# Patient Record
Sex: Male | Born: 1975 | Race: White | Hispanic: No | Marital: Single | Smoking: Current every day smoker
Health system: Southern US, Community
[De-identification: ages and names within clinical notes are randomized; demographics above are authoritative.]

## PROBLEM LIST (undated history)

## (undated) DIAGNOSIS — I10 Essential (primary) hypertension: Secondary | ICD-10-CM

## (undated) DIAGNOSIS — Z8639 Personal history of other endocrine, nutritional and metabolic disease: Secondary | ICD-10-CM

---

## 2012-02-03 ENCOUNTER — Encounter (HOSPITAL_COMMUNITY): Payer: Self-pay | Admitting: Emergency Medicine

## 2012-02-03 ENCOUNTER — Emergency Department (HOSPITAL_COMMUNITY)
Admission: EM | Admit: 2012-02-03 | Discharge: 2012-02-04 | Disposition: A | Discharge status: Home / Self Care | Payer: BC Managed Care – PPO | Attending: Emergency Medicine | Admitting: Emergency Medicine

## 2012-02-03 DIAGNOSIS — F410 Panic disorder [episodic paroxysmal anxiety] without agoraphobia: Secondary | ICD-10-CM | POA: Insufficient documentation

## 2012-02-03 DIAGNOSIS — R780 Finding of alcohol in blood: Secondary | ICD-10-CM

## 2012-02-03 HISTORY — DX: Essential (primary) hypertension: I10

## 2012-02-03 LAB — CBC WITH DIFFERENTIAL/PLATELET
Basophils Relative: 1 % (ref 0–1)
Eosinophils Relative: 5 % (ref 0–5)
HCT: 46.7 % (ref 39.0–52.0)
Hemoglobin: 16.8 g/dL (ref 13.0–17.0)
MCHC: 36 g/dL (ref 30.0–36.0)
MCV: 92.8 fL (ref 78.0–100.0)
Monocytes Absolute: 0.7 10*3/uL (ref 0.1–1.0)
Monocytes Relative: 7 % (ref 3–12)
Neutro Abs: 4.1 10*3/uL (ref 1.7–7.7)

## 2012-02-03 LAB — BASIC METABOLIC PANEL
BUN: 10 mg/dL (ref 6–23)
Chloride: 98 mEq/L (ref 96–112)
GFR calc Af Amer: >90 mL/min (ref >90)
GFR calc non Af Amer: >90 mL/min (ref >90)
Potassium: 3.5 mEq/L (ref 3.5–5.1)
Sodium: 138 mEq/L (ref 135–145)

## 2012-02-03 LAB — ETHANOL: Alcohol, Ethyl (B): 239 mg/dL — ABNORMAL HIGH (ref 0–11)

## 2012-02-03 NOTE — ED Notes (Addendum)
Pt states had a panic attack Friday morning. Stated he was having a bad day, had never had anything happen like that before. Pt here to get cleared so he can go back to work.

## 2012-02-03 NOTE — ED Notes (Signed)
Patient's significant other reports that patient has been having panic attacks. When I ask patient what he wants to be seen for, patient states that he is fine today. Reports that other days he freaks out and can't handle it. Patient reports calling out of work and states that he was told he needed a psych evaluation before returning back to work. States he wants to speak to a psychiatrist.

## 2012-02-04 LAB — RAPID URINE DRUG SCREEN, HOSP PERFORMED
Barbiturates: NOT DETECTED
Cocaine: NOT DETECTED
Opiates: NOT DETECTED

## 2012-02-04 MED ORDER — LORAZEPAM 0.5 MG PO TABS: 0.5000 mg | ORAL_TABLET | Freq: Three times a day (TID) | ORAL | Status: AC | PRN

## 2012-02-04 MED ORDER — ACETAMINOPHEN 325 MG PO TABS
650.0000 mg | ORAL_TABLET | ORAL | Status: DC | PRN
Start: 2012-02-04 — End: 2012-02-04

## 2012-02-04 NOTE — ED Notes (Signed)
Called to recheck on telepsych, advised it should not be too much longer.

## 2012-02-04 NOTE — ED Notes (Signed)
EDP stated she discarded paprework & script since she did not get to speak to pt before leaving.

## 2012-02-04 NOTE — ED Notes (Signed)
telepsych report given to EDP.

## 2012-02-04 NOTE — ED Provider Notes (Signed)
05:50 Dr Henderson Cloud has done telepsych consult. Feel patient had a panic attack, recommends lorazepam 0.5 mg PRN return of symptoms.  Pt left without getting his discharge instructions or his return to work note.   Devoria Albe, MD, FACEP   Ward Givens, MD 02/04/12 [phone]

## 2012-02-04 NOTE — ED Notes (Addendum)
Secretary called again in regards to telepsych. Was advised had been signed up for by Dr. Jerilynn Som awaiting Dr call.

## 2012-02-04 NOTE — ED Notes (Addendum)
Family member stating she has to leave in the next 5 minutes to meet other obligations she has this morning. Advised EDP was working to get them out as soon as possible but would not be in the next 5 minutes. Pt left w/o any paperwork, did not sign any paperwork. Stated they would return later to get pt papers. EDP notified.

## 2012-02-04 NOTE — ED Notes (Signed)
UDS testing done at [facility] & returned negative. EDP notified.

## 2012-02-04 NOTE — ED Notes (Addendum)
Family member upset w/ the delay in pt seeing psychiatrist.

## 2012-02-04 NOTE — ED Provider Notes (Signed)
Medical screening examination/treatment/procedure(s) were performed by non-physician practitioner and as supervising physician I was immediately available for consultation/collaboration.   Carleene Cooper III, MD 02/04/12 [phone]

## 2012-02-04 NOTE — ED Notes (Signed)
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  Pt denied complaints at this time and states he will return if he has any concerns.  Pt ambulatory without diff, discharged in company of his mother
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
## 2012-02-04 NOTE — ED Notes (Signed)
Pt doing telepsych interview at this time.

## 2012-03-10 ENCOUNTER — Emergency Department (HOSPITAL_COMMUNITY)
Admission: EM | Admit: 2012-03-10 | Discharge: 2012-03-10 | Disposition: A | Discharge status: Home / Self Care | Payer: BC Managed Care – PPO | Attending: Emergency Medicine | Admitting: Emergency Medicine

## 2012-03-10 ENCOUNTER — Emergency Department (HOSPITAL_COMMUNITY): Payer: BC Managed Care – PPO

## 2012-03-10 ENCOUNTER — Encounter (HOSPITAL_COMMUNITY): Payer: Self-pay | Admitting: *Deleted

## 2012-03-10 DIAGNOSIS — Y9229 Other specified public building as the place of occurrence of the external cause: Secondary | ICD-10-CM | POA: Insufficient documentation

## 2012-03-10 DIAGNOSIS — S0230XA Fracture of orbital floor, unspecified side, initial encounter for closed fracture: Secondary | ICD-10-CM

## 2012-03-10 DIAGNOSIS — S022XXA Fracture of nasal bones, initial encounter for closed fracture: Secondary | ICD-10-CM | POA: Insufficient documentation

## 2012-03-10 DIAGNOSIS — S02400A Malar fracture unspecified, initial encounter for closed fracture: Secondary | ICD-10-CM | POA: Insufficient documentation

## 2012-03-10 DIAGNOSIS — S02401A Maxillary fracture, unspecified, initial encounter for closed fracture: Secondary | ICD-10-CM | POA: Insufficient documentation

## 2012-03-10 DIAGNOSIS — S02402A Zygomatic fracture, unspecified, initial encounter for closed fracture: Secondary | ICD-10-CM

## 2012-03-10 DIAGNOSIS — H5710 Ocular pain, unspecified eye: Secondary | ICD-10-CM | POA: Insufficient documentation

## 2012-03-10 DIAGNOSIS — M542 Cervicalgia: Secondary | ICD-10-CM | POA: Insufficient documentation

## 2012-03-10 DIAGNOSIS — H5789 Other specified disorders of eye and adnexa: Secondary | ICD-10-CM | POA: Insufficient documentation

## 2012-03-10 MED ORDER — AMOXICILLIN-POT CLAVULANATE 875-125 MG PO TABS: 1.0000 | ORAL_TABLET | Freq: Two times a day (BID) | ORAL | Status: AC

## 2012-03-10 MED ORDER — OXYCODONE-ACETAMINOPHEN 5-325 MG PO TABS
1.0000 | ORAL_TABLET | Freq: Once | ORAL | Status: AC
  Administered 2012-03-10: 1 via ORAL
  Filled 2012-03-10 (×2): qty 1

## 2012-03-10 MED ORDER — OXYCODONE-ACETAMINOPHEN 5-325 MG PO TABS: 1.0000 | ORAL_TABLET | Freq: Once | ORAL | Status: DC

## 2012-03-10 MED ORDER — OXYCODONE-ACETAMINOPHEN 5-325 MG PO TABS: 1.0000 | ORAL_TABLET | ORAL | Status: AC | PRN
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
## 2012-03-10 NOTE — ED Notes (Signed)
Pt is resting comfortably and sats 90% on RA while sleeping and in no distress
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
## 2012-03-10 NOTE — ED Notes (Signed)
Pt with right sided facial swelling, rt black eye.
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
## 2012-03-11 NOTE — ED Provider Notes (Signed)
Medical screening examination/treatment/procedure(s) were conducted as a shared visit with non-physician practitioner(s) and myself.  I personally evaluated the patient during the encounter  Cheri Guppy, MD 03/11/12 2314
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
## 2014-11-24 ENCOUNTER — Inpatient Hospital Stay
Admission: EM | Admit: 2014-11-24 | Discharge: 2014-11-30 | DRG: 885 | Disposition: A | Discharge status: Home / Self Care | Payer: No Typology Code available for payment source | Source: Other Acute Inpatient Hospital | Attending: Psychiatry | Admitting: Psychiatry

## 2014-11-24 DIAGNOSIS — Z8249 Family history of ischemic heart disease and other diseases of the circulatory system: Secondary | ICD-10-CM

## 2014-11-24 DIAGNOSIS — I10 Essential (primary) hypertension: Secondary | ICD-10-CM | POA: Diagnosis present

## 2014-11-24 DIAGNOSIS — E785 Hyperlipidemia, unspecified: Secondary | ICD-10-CM

## 2014-11-24 DIAGNOSIS — F332 Major depressive disorder, recurrent severe without psychotic features: Secondary | ICD-10-CM | POA: Diagnosis present

## 2014-11-24 DIAGNOSIS — G4700 Insomnia, unspecified: Secondary | ICD-10-CM | POA: Diagnosis present

## 2014-11-24 DIAGNOSIS — F17210 Nicotine dependence, cigarettes, uncomplicated: Secondary | ICD-10-CM | POA: Diagnosis present

## 2014-11-24 DIAGNOSIS — F4010 Social phobia, unspecified: Secondary | ICD-10-CM

## 2014-11-24 DIAGNOSIS — F419 Anxiety disorder, unspecified: Secondary | ICD-10-CM | POA: Diagnosis present

## 2014-11-24 DIAGNOSIS — F1020 Alcohol dependence, uncomplicated: Secondary | ICD-10-CM

## 2014-11-24 DIAGNOSIS — E780 Pure hypercholesterolemia: Secondary | ICD-10-CM | POA: Diagnosis present

## 2014-11-24 DIAGNOSIS — F17200 Nicotine dependence, unspecified, uncomplicated: Secondary | ICD-10-CM

## 2014-11-24 HISTORY — DX: Personal history of other endocrine, nutritional and metabolic disease: Z86.39

## 2014-11-24 MED ORDER — HYDROXYZINE HCL 50 MG/ML IM SOLN
50.0000 mg | Freq: Three times a day (TID) | INTRAMUSCULAR | Status: DC | PRN
  Filled 2014-11-24: qty 1

## 2014-11-24 MED ORDER — MAGNESIUM HYDROXIDE 400 MG/5ML PO SUSP: 30.0000 mL | Freq: Every day | ORAL | Status: DC | PRN

## 2014-11-24 MED ORDER — HYDRALAZINE HCL 25 MG PO TABS
25.0000 mg | ORAL_TABLET | Freq: Three times a day (TID) | ORAL | Status: DC | PRN
  Administered 2014-11-25 – 2014-11-29 (×5): 25 mg via ORAL
  Filled 2014-11-24 (×7): qty 1

## 2014-11-24 MED ORDER — IBUPROFEN 600 MG PO TABS
600.0000 mg | ORAL_TABLET | Freq: Four times a day (QID) | ORAL | Status: DC | PRN
  Administered 2014-11-27 – 2014-11-29 (×5): 600 mg via ORAL
  Filled 2014-11-24 (×7): qty 1

## 2014-11-24 MED ORDER — CITALOPRAM HYDROBROMIDE 20 MG PO TABS
20.0000 mg | ORAL_TABLET | Freq: Every day | ORAL | Status: DC
  Administered 2014-11-25 – 2014-11-30 (×6): 20 mg via ORAL
  Filled 2014-11-24 (×6): qty 1

## 2014-11-24 MED ORDER — NICOTINE 10 MG IN INHA
1.0000 | RESPIRATORY_TRACT | Status: DC | PRN
  Administered 2014-11-25: 1 via RESPIRATORY_TRACT
  Filled 2014-11-24: qty 36

## 2014-11-24 MED ORDER — ALUM & MAG HYDROXIDE-SIMETH 200-200-20 MG/5ML PO SUSP: 30.0000 mL | ORAL | Status: DC | PRN

## 2014-11-24 MED ORDER — ACETAMINOPHEN 325 MG PO TABS
650.0000 mg | ORAL_TABLET | Freq: Four times a day (QID) | ORAL | Status: DC | PRN
  Administered 2014-11-24: 650 mg via ORAL
  Filled 2014-11-24: qty 2

## 2014-11-24 MED ORDER — TRAZODONE HCL 100 MG PO TABS
100.0000 mg | ORAL_TABLET | Freq: Every evening | ORAL | Status: DC | PRN
  Administered 2014-11-25 – 2014-11-29 (×5): 100 mg via ORAL
  Filled 2014-11-24 (×5): qty 1

## 2014-11-24 NOTE — Tx Team (Signed)
Initial Interdisciplinary Treatment Plan   PATIENT STRESSORS: Financial difficulties Occupational concerns Substance abuse    PATIENT STRENGTHS: Ability for insight Average or above average intelligence Capable of independent living Physical Health Supportive family/friends  PROBLEM LIST: Problem List/Patient Goals Date to be addressed Date deferred Reason deferred Estimated date of resolution  SI/Attempt 11/24/14     Alcohol Abuse 11/24/14     Depression 11/24/14                                          DISCHARGE CRITERIA:  Improved stabilization in mood, thinking, and/or behavior  PRELIMINARY DISCHARGE PLAN: Outpatient therapy  PATIENT/FAMIILY INVOLVEMENT: This treatment plan has been presented to and reviewed with the patient, Dylan Hughes.  The patient  has been given the opportunity to ask questions and make suggestions.  Hamilton CapriQueenetta H Eudell Julian 11/24/2014, 11:50 PM

## 2014-11-25 DIAGNOSIS — F1020 Alcohol dependence, uncomplicated: Secondary | ICD-10-CM

## 2014-11-25 DIAGNOSIS — F4010 Social phobia, unspecified: Secondary | ICD-10-CM

## 2014-11-25 DIAGNOSIS — F17200 Nicotine dependence, unspecified, uncomplicated: Secondary | ICD-10-CM

## 2014-11-25 DIAGNOSIS — E785 Hyperlipidemia, unspecified: Secondary | ICD-10-CM

## 2014-11-25 LAB — COMPREHENSIVE METABOLIC PANEL
ALK PHOS: 41 U/L (ref 38–126)
ALT: 20 U/L (ref 17–63)
AST: 20 U/L (ref 15–41)
Albumin: 4.3 g/dL (ref 3.5–5.0)
Anion gap: 8 (ref 5–15)
BILIRUBIN TOTAL: 0.4 mg/dL (ref 0.3–1.2)
BUN: 19 mg/dL (ref 6–20)
CO2: 29 mmol/L (ref 22–32)
Calcium: 9.2 mg/dL (ref 8.9–10.3)
Chloride: 98 mmol/L — ABNORMAL LOW (ref 101–111)
Creatinine, Ser: 0.97 mg/dL (ref 0.61–1.24)
Glucose, Bld: 98 mg/dL (ref 65–99)
Potassium: 4 mmol/L (ref 3.5–5.1)
Sodium: 135 mmol/L (ref 135–145)
Total Protein: 7.5 g/dL (ref 6.5–8.1)

## 2014-11-25 LAB — VITAMIN B12: Vitamin B-12: 266 pg/mL (ref 180–914)

## 2014-11-25 LAB — TSH: TSH: 2.581 u[IU]/mL (ref 0.350–4.500)

## 2014-11-25 MED ORDER — SIMVASTATIN 20 MG PO TABS
20.0000 mg | ORAL_TABLET | Freq: Every day | ORAL | Status: DC
  Administered 2014-11-25 – 2014-11-29 (×5): 20 mg via ORAL
  Filled 2014-11-25 (×5): qty 1

## 2014-11-25 MED ORDER — PNEUMOCOCCAL VAC POLYVALENT 25 MCG/0.5ML IJ INJ
0.5000 mL | INJECTION | INTRAMUSCULAR | Status: AC
  Administered 2014-11-26: 0.5 mL via INTRAMUSCULAR
  Filled 2014-11-25: qty 0.5

## 2014-11-25 MED ORDER — HYDROCHLOROTHIAZIDE 12.5 MG PO CAPS
12.5000 mg | ORAL_CAPSULE | Freq: Every day | ORAL | Status: DC
  Administered 2014-11-25 – 2014-11-26 (×2): 12.5 mg via ORAL
  Filled 2014-11-25 (×3): qty 1

## 2014-11-25 MED ORDER — HYDROXYZINE HCL 50 MG PO TABS
50.0000 mg | ORAL_TABLET | Freq: Three times a day (TID) | ORAL | Status: DC | PRN
  Administered 2014-11-25 – 2014-11-29 (×4): 50 mg via ORAL
  Filled 2014-11-25 (×5): qty 1

## 2014-11-25 NOTE — Progress Notes (Signed)
Recreation Therapy Notes  INPATIENT RECREATION THERAPY ASSESSMENT  Patient Details Name: Dylan Hughes MRN: [id number] DOB: 07/08/1976 Today's Date: 11/25/2014  Patient Stressors: Death, Other (Comment) (Trying to find another job and finances)  Coping Skills:   Isolate, Substance Abuse, Avoidance, Self-Injury, Art/Dance, Music, Sports  Personal Challenges: Anger, Concentration, Decision-Making, Expressing Yourself, Relationships, Self-Esteem/Confidence, Social Interaction, Stress Management, Substance Abuse, Trusting Others  Leisure Interests (2+):  Games - Video games, Technical brewerature - Therapist, artishing  Awareness of Community Resources:  No  Community Resources:     Current Use:    If no, Barriers?:    Patient Strengths:  No  Patient Identified Areas of Improvement:  Self-control and drinking  Current Recreation Participation:  Playing on play station  Patient Goal for Hospitalization:  To figure out what his triggers are and not come back, be more open and honest  [place]   Current SI (including self-harm):  No  Current HI:  No  Consent to Intern Participation: N/A   Jacquelynn CreeGreene,Mandeep Ferch M, LRT/CTRS 11/25/2014, 1:51 PM

## 2014-11-25 NOTE — [facility] Group Notes (Signed)
[facility] Group Notes:  (Nursing/MHT/Case Management/Adjunct)  Date:  11/25/2014  Time:  12:41 PM  Type of Therapy:  Psychoeducational Skills  Participation Level:  Active  Participation Quality:  Appropriate, Attentive and Sharing  Affect:  Appropriate  Cognitive:  Alert and Appropriate  Insight:  Appropriate  Engagement in Group:  Engaged  Modes of Intervention:  Discussion and Education  Summary of Progress/Problems:  Lynelle SmokeCara Travis [facility] 11/25/2014, 12:41 PM

## 2014-11-25 NOTE — Progress Notes (Signed)
   11/25/14 1300  Clinical Encounter Type  Visited With Patient  Visit Type Spiritual support  Referral From Physician  Consult/Referral To Chaplain  Spiritual Encounters  Spiritual Needs Emotional  Stress Factors  Patient Stress Factors Major life changes  Family Stress Factors None identified  Advance Directives (For Healthcare)  Does patient have an advance directive? No  Would patient like information on creating an advanced directive? No - patient declined information   Chaplain provided therapeutic presence and empathic listening. Chaplain spoke with patient who was on his way into a group session. Patient said he may want to talk later today. I asked him to page the on-call chaplain if he wants to talk later in the day.   AD [phone]

## 2014-11-25 NOTE — Progress Notes (Signed)
Quiet and reserved, minimal interaction noted with peers. Denies SI although presents with sad affect.  Medication and group compliant.  Maintaining personal care chores appropirately.

## 2014-11-25 NOTE — BH Assessment (Signed)
Assessment Note  Dylan Hughes is an 39 y.o. male  Who [facility] another local ER due to a suicide attempt. Pt. attempted to hang himself. Due to a lack of financial means and work, the pt. became frustrated and upset.   Axis I: Alcohol Abuse and Major Depression, Recurrent severe Axis III:  Past Medical History  Diagnosis Date  . Hypertension   . History of high cholesterol    Axis IV: economic problems, housing problems, other psychosocial or environmental problems and problems related to social environment  Past Medical History:  Past Medical History  Diagnosis Date  . Hypertension   . History of high cholesterol     History reviewed. No pertinent past surgical history.  Family History: History reviewed. No pertinent family history.  Social History:  reports that he has been smoking Cigarettes.  He has a 20 pack-year smoking history. He does not have any smokeless tobacco history on file. He reports that he drinks about 16.8 oz of alcohol per week. He reports that he does not use illicit drugs.  Additional Social History:  Alcohol / Drug Use Pain Medications: None reported Prescriptions: None reported Over the Counter: None reported History of alcohol / drug use?: Yes Longest period of sobriety (when/how long): Unknown Negative Consequences of Use: Financial, Personal relationships, Work / School Withdrawal Symptoms:  (None reported) Substance #1 Name of Substance 1: Alcohol  1 - Age of First Use: Unknown 1 - Amount (size/oz): "Pint, 12 pack and a couple of bottles of wine" 1 - Frequency: Daily 1 - Duration: "Years" 1 - Last Use / Amount: 11/18/2014  CIWA: CIWA-Ar BP: 119/76 mmHg Pulse Rate: 60 Nausea and Vomiting: no nausea and no vomiting Tactile Disturbances: none Tremor: no tremor Auditory Disturbances: not present Paroxysmal Sweats: no sweat visible Visual Disturbances: mild sensitivity Anxiety: no anxiety, at ease Headache, Fullness in  Head: mild Agitation: normal activity Orientation and Clouding of Sensorium: oriented and can do serial additions CIWA-Ar Total: 4 COWS:    Allergies: No Known Allergies  Home Medications:  Medications Prior to Admission  Medication Sig Dispense Refill  . ibuprofen (ADVIL,MOTRIN) 200 MG tablet Take 400 mg by mouth every 6 (six) hours as needed. For pain      OB/GYN Status:  No LMP for male patient.  General Assessment Data Location of Assessment: [facility] Assessment Services TTS Assessment: Out of system Is this a Tele or Face-to-Face Assessment?: Tele Assessment Is this an Initial Assessment or a Re-assessment for this encounter?: Initial Assessment Marital status: Single Maiden name: n/a Is patient pregnant?: No Living Arrangements: Alone Can pt return to current living arrangement?: Yes Admission Status: Involuntary Is patient capable of signing voluntary admission?: No Referral Source: Self/Family/Friend [facility] Insurance type: None  Medical Screening Exam [facility] Walk-in ONLY) Medical Exam completed: Yes  Crisis Care Plan Living Arrangements: Alone Name of Psychiatrist: n/a Name of Therapist: n/a  Education Status Is patient currently in school?: No Current Grade: n/a Highest grade of school patient has completed: 12 Name of school: n/a Contact person: n/a  Risk to self with the past 6 months Suicidal Ideation: Yes-Currently Present Has patient been a risk to self within the past 6 months prior to admission? : Yes Suicidal Intent: Yes-Currently Present Has patient had any suicidal intent within the past 6 months prior to admission? : Yes Is patient at risk for suicide?: Yes Suicidal Plan?: Yes-Currently Present Has patient had any suicidal plan within the past  6 months prior to admission? : Yes Specify Current Suicidal Plan: Pt. attempted to hang himself. Access to Means: Yes Specify Access to Suicidal Means: Have access to cord What has been  your use of drugs/alcohol within the last 12 months?: Alcohol use Previous Attempts/Gestures: Yes How many times?: 5 Other Self Harm Risks: Start cutting a 9 years Triggers for Past Attempts: Family contact (Lack of money) Intentional Self Injurious Behavior: Cutting Comment - Self Injurious Behavior: cutting at age 11 Family Suicide History: No Recent stressful life event(s): Loss (Comment), Conflict (Comment), Other (Comment) Persecutory voices/beliefs?: No Depression: Yes Depression Symptoms: Feeling worthless/self pity, Feeling angry/irritable, Tearfulness, Fatigue, Isolating Substance abuse history and/or treatment for substance abuse?: Yes Suicide prevention information given to non-admitted patients: Not applicable  Risk to Others within the past 6 months Homicidal Ideation: No Does patient have any lifetime risk of violence toward others beyond the six months prior to admission? : No Thoughts of Harm to Others: No Current Homicidal Intent: No Current Homicidal Plan: No Access to Homicidal Means: No Identified Victim: None reported History of harm to others?: No Assessment of Violence: None Noted Violent Behavior Description: None reported Does patient have access to weapons?: No Criminal Charges Pending?: No Does patient have a court date: No Is patient on probation?: No  Psychosis Hallucinations: None noted Delusions: None noted  Mental Status Report Appearance/Hygiene: Unremarkable Eye Contact: Other (Comment) Motor Activity: Unremarkable, Freedom of movement Speech: Logical/coherent Level of Consciousness: Alert Mood: Depressed, Anxious Affect: Sad Anxiety Level: Minimal Thought Processes: Coherent, Relevant Judgement: Unimpaired Orientation: Person, Place, Time, Situation, Appropriate for developmental age Obsessive Compulsive Thoughts/Behaviors: None  Cognitive Functioning Concentration: Normal Memory: Recent Intact, Remote Intact IQ: Average Insight:  Poor Impulse Control: Poor Appetite: Fair Weight Loss: 0 Weight Gain: 0 Sleep: No Change Total Hours of Sleep: 7 Vegetative Symptoms: None  ADLScreening [facility] Assessment Services) Patient's cognitive ability adequate to safely complete daily activities?: Yes Patient able to express need for assistance with ADLs?: Yes Independently performs ADLs?: Yes (appropriate for developmental age)  Prior Inpatient Therapy Prior Inpatient Therapy: Yes Prior Therapy Dates: 2002 Prior Therapy Facilty/Provider(s): Unknown Reason for Treatment: Suicidal attempt  Prior Outpatient Therapy Prior Outpatient Therapy: No Prior Therapy Dates: n/a Prior Therapy Facilty/Provider(s): n/a Reason for Treatment: n/a Does patient have an ACCT team?: No Does patient have Intensive In-House Services?  : No Does patient have [facility] services? : No Does patient have P4CC services?: No  ADL Screening (condition at time of admission) Patient's cognitive ability adequate to safely complete daily activities?: Yes Is the patient deaf or have difficulty hearing?: No Does the patient have difficulty seeing, even when wearing glasses/contacts?: No Does the patient have difficulty concentrating, remembering, or making decisions?: Yes (Sometimes concentrating. Memory is bad.) Patient able to express need for assistance with ADLs?: Yes Does the patient have difficulty dressing or bathing?: No Independently performs ADLs?: Yes (appropriate for developmental age) Does the patient have difficulty walking or climbing stairs?: No Weakness of Legs: None Weakness of Arms/Hands: None  Home Assistive Devices/Equipment Home Assistive Devices/Equipment: None  Therapy Consults (therapy consults require a physician order) PT Evaluation Needed: No OT Evalulation Needed: No SLP Evaluation Needed: No Abuse/Neglect Assessment (Assessment to be complete while patient is alone) Physical Abuse: Denies Verbal Abuse: Denies Sexual  Abuse: Denies Exploitation of patient/patient's resources: Denies Self-Neglect: Denies Values / Beliefs Cultural Requests During Hospitalization: None Spiritual Requests During Hospitalization: None Consults Spiritual Care Consult Needed: No Social Work Consult Needed: No Advance  Directives (For Healthcare) Does patient have an advance directive?: No Would patient like information on creating an advanced directive?: No - patient declined information Nutrition Screen- [facility] Adult/WL/AP Patient's home diet: Regular Has the patient recently lost weight without trying?: No Has the patient been eating poorly because of a decreased appetite?: No Malnutrition Screening Tool Score: 0  Additional Information 1:1 In Past 12 Months?: No CIRT Risk: No Elopement Risk: No Does patient have medical clearance?: Yes  Child/Adolescent Assessment Running Away Risk: Denies (Pt. is an adult)  Disposition:  Disposition Initial Assessment Completed for this Encounter: Yes Disposition of Patient: Inpatient treatment program Type of inpatient treatment program: [facility]  On Site Evaluation by:   Reviewed with Physician:    Lilyan Gilfordalvin J. Esperanza Madrazo, MS, LCAS, LPC, NCC, CCSI 11/25/2014 7:59 PM

## 2014-11-25 NOTE — Progress Notes (Signed)
39 year old male transferred [facility].  Patient went to the ER after being found by his girlfriend. Pt was in the closet with a cord around his neck. During admission process patient says he doesn't know what triggered his attempt. Endorses feeling depressed and hopeless, anxious, poor energy, and sad. Currently denies si/hi and avh. Expresses worry about having a job and place to live when he is discharged. He also reports that he has had several suicide attempts in the past. Says he has laid in the [place], jumped out of a 2-story window when he was about 39 years old, cut his wrist,  and also walked into traffic. Pt says that he started cutting himself at age 89. Last cut himself 2 months ago. Says he cuts when angry or frustrated. Drinks every other day. Says he drinks whatever he can afford at the time. He may drink a pint of liquor, or 12 pack of beer, or a couple of bottles of wine. Says he has never been in withdrawals. Reports last drink was Wednesday (11/18/14). BP elevated 155/108. Rechecked 158/112. Dr. Christia ReadingHernedez notified. Order for Hydralazine 25mg  at 0001. Recheck 119/76 pulse 60. Smokes at least 1 pack of cigarettes per day (rolls his own cigarettes).  Patient is monitored Q 15 minutes for safety. Will cont to monitor and provide support.

## 2014-11-25 NOTE — H&P (Signed)
Psychiatric Admission Assessment Adult  Patient Identification: Dylan Hughes MRN:  [id number] Date of Evaluation:  11/25/2014 Chief Complaint:  major depression Principal Diagnosis: Major depressive disorder, recurrent episode, severe Diagnosis:   Patient Active Problem List   Diagnosis Date Noted  . Alcohol use disorder, severe, dependence [F10.20] 11/25/2014  . Tobacco use disorder [Z72.0] 11/25/2014  . Social anxiety disorder [F40.10] 11/25/2014  . Major depressive disorder, recurrent episode, severe [F33.2] 11/25/2014  . HTN (hypertension) [I10] 11/25/2014  . 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  This patient was referred for psychiatric admission by [facility] after a suicidal attempt. On May 12 patient attempted to hang himself with a quart that he had over a close at all in his house. He guesses the cord broke because he was awakened by her [place] police officer hold took him to the emergency room. The police officer reported he had received a phone call from the patient's girlfriend, the patient had called her and told her that he was going to harm himself. When the officer arrived at the house he found the patient lying on the closet floor. He was unconscious, naked and had an empty alcohol container lying right next to him. At arrival to the emergency room his alcohol blood level was 180. Today the patient reported that he is overwhelmed with his current situation. He lost his driver's license due to DWIs. He is working at [employer] restaurant only a few hours a week not making enough money to cover all his bills. He is forced to only eat once a day as he cannot afford more than that. He has refused to go to social services and apply for food stains because "I don't want any handouts".  Patient is currently looking for another  job but because he is a felon he is having difficulty finding another employment. He reports a prior history of depression but denies ever being treated for it. The patient has attempted suicide in the past by jumping out of a window when he was a child and cutting his wrists a few years ago.  Patient reports his thyroid bleeding in this way but denies wanting to hurt himself now. He realizes how this suicidal attempt has harm his girlfriend and other family members (father and his children).   In terms of substance abuse the patient reports drinking alcohol heavily since he was a teenager. Currently he has been drinking about every other day but can drink a large amount of alcohol. He feels he has a high tolerance for it. In the past he reports having blackouts but denies ever experiencing withdrawals.  He denies ever being admitted to detox or rehabilitation.  He attended AA only one time. Denies the use of any other illicit substances or abusing prescription medications. As a teenager he used cannabis but has not used it in several years. He smokes about one pack of cigarettes per day.  Patient acknowledged that alcohol plays a big part in exacerbating his depression he feels that he will benefit from treatment for his alcoholism.   Elements:  Severity:  Severe. Timing:  Chronic with acute exacerbation. Duration:  6 days. Context:  Alcohol intoxication and multiple social and financial stressors. Associated Signs/Symptoms: Depression Symptoms:  depressed mood, suicidal attempt, anxiety, (Hypo) Manic Symptoms:  none Anxiety Symptoms:  Specific Phobias, patient does describe difficulties with social  settings. Identifies himself as shy and introverted. In order to participate in a social activity he feels he is forced to drink first.   Psychotic Symptoms:  none PTSD Symptoms: Denies history of traumatic events Negative Total Time spent with patient: 1 hour   Past psychiatric history: Patient  denies prior treatment for mental illness or substance abuse. In 2002 the patient was intoxicated and then cut his wrists which require sutures. Patient was admitted to an inpatient facility for a short period of time (48-72 hours) but as he minimize the symptoms and the severity of his depression that letting go. Patient also reports that at around the age of 8 he jumped out of a window in an attempt to hurt himself he did not suffer any injuries to his parents never knew about this. The patient reports history of self-injurious behaviors he has been cutting himself superficially since the age of 66. He has not done so in a few months. He shows some scars in his abdomen upper arms and legs.  Past Medical History: Patient suffers from hypertension but is not currently taking any medications. He also suffers from dyslipidemia and takes simvastatin 20 mg a day. Denies any surgical history. He denies any history of seizures. He does report having a means to cut accidents with head trauma and loss of consciousness. Patient reports that after the accident he finds remembering means of people or things difficult. Past Medical History  Diagnosis Date  . Hypertension   . History of high cholesterol    History reviewed. No pertinent past surgical history.  Family History: History reviewed. No pertinent family history. patient denies any history of mental illness in his family. Denies any history of substance abuse in his family. Denies any history of suicidal attempts in his family. Patient's mother passed away of congestive heart failure in 2007. Patient states heart problems run in his family  Social History: This patient is originally from [facility]. 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 Patient remembers being removed from the custody of his father and mother when he and his sister were very young,  around age of 5 he does not remember to  recent why.  The patient is currently separated from his wife; they had 2 children together who are 12 and 14. Patient's children currently live with the patient's father who has custody of them. . The patient is currently working at [employer]. Has been employed there since February of this year. He does not receive insurance through his employer and is only working 5 hours 3 days  a week He lives alone in an apartment in [place]. He has a girlfriend who is supportive. Educational level: Patient completed ninth grade was incarcerated he completed GED. A few years ago patient received multiple certifications at [employer]. . Legal history: Patient was in prison for 3 years. He is a felon due to charges pressed by his wife due to aggravated assault. The patient also had 3 DWIs and currently does not have a driver's license.   History  Alcohol Use  . 16.8 oz/week  . 8 Glasses of wine, 12 Cans of beer, 8 Shots of liquor per week    Comment: Every other day.     History  Drug Use No    History   Social History  . Marital Status: Single    Spouse Name: N/A  .  Number of Children: N/A  . Years of Education: N/A   Social History Main Topics  . Smoking status: Current Every Day Smoker -- 1.00 packs/day for 20 years    Types: Cigarettes  . Smokeless tobacco: Not on file  . Alcohol Use: 16.8 oz/week    8 Glasses of wine, 12 Cans of beer, 8 Shots of liquor per week     Comment: Every other day.  . Drug Use: No  . Sexual Activity: Yes   Other Topics Concern  . None   Social History Narrative   Musculoskeletal: Strength & Muscle Tone: within normal limits Gait & Station: normal Patient leans: N/A  Psychiatric Specialty Exam: Physical Exam  Constitutional: He is oriented to person, place, and time. He appears well-developed and well-nourished.  HENT:  Head: Normocephalic and atraumatic.  Eyes: EOM are normal.  Neck: Normal range of motion. Neck  supple.  Cardiovascular: Normal rate and regular rhythm.   Respiratory: Effort normal and breath sounds normal.  GI: Soft. Bowel sounds are normal.  Musculoskeletal: Normal range of motion.  Neurological: He is alert and oriented to person, place, and time.  Skin: Skin is warm and dry.  Physical exam [facility].  Review of Systems  Constitutional: Negative.   HENT: Negative.   Eyes: Negative.   Respiratory: Negative.   Cardiovascular: Positive for chest pain.  Gastrointestinal: Negative.   Genitourinary: Negative.   Musculoskeletal: Negative.   Skin: Negative.   Neurological: Negative.   Endo/Heme/Allergies: Negative.   Psychiatric/Behavioral: Positive for depression and substance abuse. The patient is nervous/anxious.     Blood pressure 119/76, pulse 60, temperature 98.2 F (36.8 C), temperature source Oral, resp. rate 20, height 5' 2.01" (1.575 m), weight 53.978 kg (119 lb), SpO2 98 %.Body mass index is 21.76 kg/(m^2).  General Appearance: Well Groomed  Patent attorneyye Contact::  Good  Speech:  Normal Rate  Volume:  Normal  Mood:  Anxious and Dysphoric  Affect:  Congruent  Thought Process:  Linear and Logical  Orientation:  Full (Time, Place, and Person)  Thought Content:  Hallucinations: None  Suicidal Thoughts:  No  Homicidal Thoughts:  No  Memory:  Immediate;   Good Recent;   Good Remote;   Good  Judgement:  Fair  Insight:  Fair  Psychomotor Activity:  Normal  Concentration:  Good  Recall:  NA  Fund of Knowledge:Good  Language: Good  Akathisia:  No  Handed:    AIMS (if indicated):     Assets:  Communication Skills Desire for Improvement Housing Intimacy Physical Health Social Support Vocational/Educational  ADL's:  Intact  Cognition: WNL  Sleep:  Number of Hours: 5   Risk to Self: Is patient at risk for suicide?: Yes (Currently denies si. Contracts for safety.) Risk to Others:   Prior Inpatient Therapy:   Prior Outpatient  Therapy:    Alcohol Screening: 1. How often do you have a drink containing alcohol?: 4 or more times a week 2. How many drinks containing alcohol do you have on a typical day when you are drinking?: 10 or more 3. How often do you have six or more drinks on one occasion?: Daily or almost daily Preliminary Score: 8 4. How often during the last year have you found that you were not able to stop drinking once you had started?: Daily or almost daily 5. How often during the last year have you failed to do what was normally expected from you becasue of  drinking?: Less than monthly 6. How often during the last year have you needed a first drink in the morning to get yourself going after a heavy drinking session?: Never 7. How often during the last year have you had a feeling of guilt of remorse after drinking?: Monthly 8. How often during the last year have you been unable to remember what happened the night before because you had been drinking?: Less than monthly 9. Have you or someone else been injured as a result of your drinking?: No 10. Has a relative or friend or a doctor or another health worker been concerned about your drinking or suggested you cut down?: Yes, during the last year Alcohol Use Disorder Identification Test Final Score (AUDIT): 24 Brief Intervention: MD notified of score 20 or above  Allergies:  No Known Allergies Lab Results: No results found for this or any previous visit (from the past 48 hour(s)). Current Medications: Current Facility-Administered Medications  Medication Dose Route Frequency Provider Last Rate Last Dose  . acetaminophen (TYLENOL) tablet 650 mg  650 mg Oral Q6H PRN Jimmy Footman, MD   650 mg at 11/24/14 2233  . alum & mag hydroxide-simeth (MAALOX/MYLANTA) 200-200-20 MG/5ML suspension 30 mL  30 mL Oral Q4H PRN Jimmy Footman, MD      . citalopram (CELEXA) tablet 20 mg  20 mg Oral Daily Jimmy Footman, MD   20 mg at 11/25/14  1011  . hydrALAZINE (APRESOLINE) tablet 25 mg  25 mg Oral TID PRN Jimmy Footman, MD   25 mg at 11/25/14 0001  . hydrochlorothiazide (MICROZIDE) capsule 12.5 mg  12.5 mg Oral Daily Jimmy Footman, MD      . hydrOXYzine (ATARAX/VISTARIL) tablet 50 mg  50 mg Oral TID PRN Jimmy Footman, MD      . ibuprofen (ADVIL,MOTRIN) tablet 600 mg  600 mg Oral Q6H PRN Jimmy Footman, MD      . magnesium hydroxide (MILK OF MAGNESIA) suspension 30 mL  30 mL Oral Daily PRN Jimmy Footman, MD      . nicotine (NICOTROL) 10 MG inhaler 1 continuous puffing  1 continuous puffing Inhalation PRN Jimmy Footman, MD      . Melene Muller ON 11/26/2014] pneumococcal 23 valent vaccine (PNU-IMMUNE) injection 0.5 mL  0.5 mL Intramuscular Tomorrow-1000 Jimmy Footman, MD      . simvastatin (ZOCOR) tablet 20 mg  20 mg Oral q1800 Jimmy Footman, MD      . traZODone (DESYREL) tablet 100 mg  100 mg Oral QHS PRN Jimmy Footman, MD   100 mg at 11/25/14 0001   PTA Medications: Prescriptions prior to admission  Medication Sig Dispense Refill Last Dose  . ibuprofen (ADVIL,MOTRIN) 200 MG tablet Take 400 mg by mouth every 6 (six) hours as needed. For pain   Past Week at Unknown    Previous Psychotropic Medications: No   Substance Abuse History in the last 12 months:  Yes.      Consequences of Substance Abuse: Legal Consequences:  past DWIs, no license Blackouts:    No results found for this or any previous visit (from the past 72 hour(s)).  Observation Level/Precautions:  15 minute checks  Laboratory:  Vitamin B-12 TSH and CMP  Psychotherapy:    Medications:    Consultations:    Discharge Concerns:    Estimated LOS:  Other:     Psychological Evaluations: No   Treatment Plan Summary: Daily contact with patient to assess and evaluate symptoms and progress in treatment and Medication management  Major  depressive disorder: We'll  continue citalopram 20 mg by mouth daily which was started at the outside facility  Anxiety: We will order Vistaril 50 mg by mouth every 8 hours when necessary for anxiety  Insomnia: Patient will be continued on trazodone 100 mg by mouth daily at bedtime when necessary.  Hypertension patient will be started on hydrochlorothiazide 12.5 mg by mouth daily. As blood pressure was elevated yesterday hydralazine 25 mg every 8 hours when necessary has been ordered for systolic blood pressure greater than 155 or diastolic blood pressure greater than 95  Dyslipidemia: Continue simvastatin 20 mg daily   Tobacco use disorder: We will offer the patient a Nicotrol inhaler when necessary  Labs I will order TSH, B12 level and comprehensive metabolic panel asked him if these were not checked at the outside facility. I will also order an EKG as patient today was complaining of having some chest pain  Medical Decision Making:  Established Problem, Worsening (2)  I certify that inpatient services furnished can reasonably be expected to improve the patient's condition.   Jimmy Footman 5/18/201611:03 AM

## 2014-11-25 NOTE — Progress Notes (Signed)
Recreation Therapy Notes  Date: 05.18.16 Time: 3:00 pm Location: Craft Room  Group Topic: Wellness  Goal Area(s) Addresses:  Patient will identify at least one item per dimension of health. Patient will examine areas they are deficient in.  Behavioral Response: Attentive  Intervention: 6 Dimensions of Health  Activity: Patients were given a worksheet with the definitions of the 6 dimensions on it and a worksheet with the 6 dimensions on it. Patients were instructed to list 1-2 things per each category that they were currently doing.  Education: LRT educated patients on the 6 dimensions of wellness.   Education Outcome: Acknowledges education/In group clarification offered  Clinical Observations/Feedback: Patient completed activity by listing at least 2 things per each category. Patient contributed to group discussion by stating what category he was not giving enough attention to, and how this activity id related to his admission and d/c.   Jacquelynn CreeGreene,Aury Scollard M, LRT/CTRS 11/25/2014 4:29 PM

## 2014-11-25 NOTE — [facility] Group Notes (Signed)
[facility]   11/25/2014 2:35 PM  Participation Quality:  Good, patients goal for today was to meet with SW and doctor to review treatment and discharge options  Mood/Affect:  Appropriate  Depression Rating:  5  Anxiety Rating:  5  Thoughts of Suicide:  No Will you contract for safety?   Yes  Current AVH:  No  Plan for Discharge/Comments:  Patient is willing to attend out patient treatment for his alcohol addiction issues. [place]  Transportation Means: This is a struggle for him and girlfriend. However he lives in walking distance to most places.  Supports: Girlfriend, father and 2 kids  [name], [place] M

## 2014-11-25 NOTE — [facility] Suicide Risk Assessment (Signed)
[facility]   Nursing information obtained from:  Patient Demographic factors:  Caucasian Current Mental Status:  NA Loss Factors:  NA Historical Factors:  Prior suicide attempts Risk Reduction Factors:  Responsible for children under 39 years of age, Employed, Positive social support, Positive therapeutic relationship Total Time spent with patient: 1 hour Principal Problem: Major depressive disorder, recurrent episode, severe Diagnosis:   Patient Active Problem List   Diagnosis Date Noted  . Alcohol use disorder, severe, dependence [F10.20] 11/25/2014  . Tobacco use disorder [Z72.0] 11/25/2014  . Social anxiety disorder [F40.10] 11/25/2014  . Major depressive disorder, recurrent episode, severe [F33.2] 11/25/2014  . HTN (hypertension) [I10] 11/25/2014  . Dyslipidemia [E78.5] 11/25/2014     Continued Clinical Symptoms:  Alcohol Use Disorder Identification Test Final Score (AUDIT): 24 The "Alcohol Use Disorders Identification Test", Guidelines for Use in Primary Care, Second Edition.  World Science writerHealth Organization [facility](WHO). Score between 0-7:  no or low risk or alcohol related problems. Score between 8-15:  moderate risk of alcohol related problems. Score between 16-19:  high risk of alcohol related problems. Score 20 or above:  warrants further diagnostic evaluation for alcohol dependence and treatment.   CLINICAL FACTORS:   Depression:   Comorbid alcohol abuse/dependence Alcohol/Substance Abuse/Dependencies Previous Psychiatric Diagnoses and Treatments   Musculoskeletal: Strength & Muscle Tone: within normal limits Gait & Station: normal Patient leans: N/A  Psychiatric Specialty Exam: Physical Exam  ROS                                                           COGNITIVE FEATURES THAT CONTRIBUTE TO RISK:  None    SUICIDE RISK:   Moderate:  Frequent suicidal ideation with limited intensity, and duration, some  specificity in terms of plans, no associated intent, good self-control, limited dysphoria/symptomatology, some risk factors present, and identifiable protective factors, including available and accessible social support.  [facility]  Medical Decision Making:  Established Problem, Worsening (2)  I certify that inpatient services furnished can reasonably be expected to improve the patient's condition.   Jimmy FootmanHernandez-Gonzalez,  Dylan Hughes 11/25/2014, 11:01 AM

## 2014-11-26 DIAGNOSIS — I10 Essential (primary) hypertension: Secondary | ICD-10-CM | POA: Insufficient documentation

## 2014-11-26 DIAGNOSIS — F332 Major depressive disorder, recurrent severe without psychotic features: Secondary | ICD-10-CM

## 2014-11-26 MED ORDER — HYDROCHLOROTHIAZIDE 25 MG PO TABS
25.0000 mg | ORAL_TABLET | Freq: Every day | ORAL | Status: DC
  Administered 2014-11-27 – 2014-11-30 (×4): 25 mg via ORAL
  Filled 2014-11-26 (×5): qty 1

## 2014-11-26 NOTE — Progress Notes (Signed)
Recreation Therapy Notes  Date: 05.19.16  Time: 3:00 pm Location: Craft Room  Group Topic: Leisure Education  Goal Area(s) Addresses:  Increase patient leisure awareness and education  Behavioral Response: Attentive, Interactive  Intervention: Leisure Interview  Activity: Patients paired up and were given a list of questions about leisure interests to ask their peer.   Education: LRT educated patients on leisure.  Education Outcome: Acknowledges education/In group clarification offered  Clinical Observations/Feedback: Patient completed activity by pairing up with peer and asking as well as answering questions. Patient contributed to group discussion by stating what is needed to participate in leisure, that he does not use his time wisely, and some ways he can participate in leisure more often.   Jacquelynn CreeGreene,Nan Maya M, LRT/CTRS 11/26/2014 5:13 PM

## 2014-11-26 NOTE — Progress Notes (Signed)
[facility] MD Progress Note  11/26/2014 9:56 AM Dylan Hughes  MRN:  [id number] Subjective:  Patient was distressed this morning as he had received a phone call from his girlfriend that upset him.  His girlfriend told him that the neighbor downstairs was asking her to have intercourse with him and he was very Careers adviser and inappropriate. She also told him that the landlord had broken the law from the store and had gotten into his apartment because the ACT was on in the neighbors downstairs were complaining that their house was to cold because of it.  . The patient was told that the door was left open and willing his girlfriend went into the house the refrigerator dose was opened and somebody had gone through his things.  Describes mood as depressed, problems with appetite, or energy.  Last night he did not sleep well. Denies SI, HI or A/V H.  Denies hallucinations, SE from medications or physical complaints.   Per nursing: Quiet and reserved, minimal interaction noted with peers. Denies SI although presents with sad affect. Medication and group compliant. Maintaining personal care chores appropirately.  Principal Problem: Major depressive disorder, recurrent, severe without psychotic features Diagnosis:   Patient Active Problem List   Diagnosis Date Noted  . Major depressive disorder, recurrent, severe without psychotic features [F33.2]   . Essential hypertension [I10]   . Alcohol use disorder, severe, dependence [F10.20] 11/25/2014  . Tobacco use disorder [Z72.0] 11/25/2014  . Social anxiety disorder [F40.10] 11/25/2014  . Dyslipidemia [E78.5] 11/25/2014   Total Time spent with patient: 30 minutes   Past Medical History:  Past Medical History  Diagnosis Date  . Hypertension   . History of high cholesterol    History reviewed. No pertinent past surgical history. Family History: History reviewed. No pertinent family history. Social History:  History  Alcohol Use  . 16.8 oz/week  . 8 Glasses of  wine, 12 Cans of beer, 8 Shots of liquor per week    Comment: Every other day.     History  Drug Use No    History   Social History  . Marital Status: Single    Spouse Name: N/A  . Number of Children: N/A  . Years of Education: N/A   Social History Main Topics  . Smoking status: Current Every Day Smoker -- 1.00 packs/day for 20 years    Types: Cigarettes  . Smokeless tobacco: Not on file  . Alcohol Use: 16.8 oz/week    8 Glasses of wine, 12 Cans of beer, 8 Shots of liquor per week     Comment: Every other day.  . Drug Use: No  . Sexual Activity: Yes   Other Topics Concern  . None   Social History Narrative   Additional History:    Sleep: Fair  Appetite:  Good   Assessment:   Musculoskeletal: Strength & Muscle Tone: within normal limits Gait & Station: normal Patient leans: N/A   Psychiatric Specialty Exam: Physical Exam  Review of Systems  Cardiovascular: Negative for chest pain.  Gastrointestinal: Negative for nausea, vomiting, abdominal pain, diarrhea and constipation.  Neurological: Positive for headaches. Negative for dizziness.  Psychiatric/Behavioral: Positive for depression. The patient has insomnia.     Blood pressure 126/86, pulse 64, temperature 98.2 F (36.8 C), temperature source Oral, resp. rate 20, height 5' 2.01" (1.575 m), weight 53.978 kg (119 lb), SpO2 98 %.Body mass index is 21.76 kg/(m^2).  General Appearance: Well Groomed  Eye Contact::  Good  Speech:  Normal  Rate  Volume:  Normal  Mood:  Dysphoric  Affect:  Constricted  Thought Process:  Linear  Orientation:  Full (Time, Place, and Person)  Thought Content:  Hallucinations: None  Suicidal Thoughts:  No  Homicidal Thoughts:  No  Memory:  Immediate;   Good Recent;   Good Remote;   Good  Judgement:  Poor  Insight:  Fair  Psychomotor Activity:  Decreased  Concentration:  Good  Recall:  NA  Fund of Knowledge:Good  Language: Good  Akathisia:  No  Handed:    AIMS (if  indicated):     Assets:  Communication Skills Desire for Improvement Housing Physical Health Social Support Vocational/Educational  ADL's:  Intact  Cognition: WNL  Sleep:  Number of Hours: 5     Current Medications: Current Facility-Administered Medications  Medication Dose Route Frequency Provider Last Rate Last Dose  . acetaminophen (TYLENOL) tablet 650 mg  650 mg Oral Q6H PRN Hildred Priest, MD   650 mg at 11/24/14 2233  . alum & mag hydroxide-simeth (MAALOX/MYLANTA) 200-200-20 MG/5ML suspension 30 mL  30 mL Oral Q4H PRN Hildred Priest, MD      . citalopram (CELEXA) tablet 20 mg  20 mg Oral Daily Hildred Priest, MD   20 mg at 11/26/14 0943  . hydrALAZINE (APRESOLINE) tablet 25 mg  25 mg Oral TID PRN Hildred Priest, MD   25 mg at 11/25/14 2152  . [START ON 11/27/2014] hydrochlorothiazide (HYDRODIURIL) tablet 25 mg  25 mg Oral Daily Hildred Priest, MD      . hydrOXYzine (ATARAX/VISTARIL) tablet 50 mg  50 mg Oral TID PRN Hildred Priest, MD   50 mg at 11/25/14 2153  . ibuprofen (ADVIL,MOTRIN) tablet 600 mg  600 mg Oral Q6H PRN Hildred Priest, MD      . magnesium hydroxide (MILK OF MAGNESIA) suspension 30 mL  30 mL Oral Daily PRN Hildred Priest, MD      . nicotine (NICOTROL) 10 MG inhaler 1 continuous puffing  1 continuous puffing Inhalation PRN Hildred Priest, MD   1 continuous puffing at 11/25/14 1803  . pneumococcal 23 valent vaccine (PNU-IMMUNE) injection 0.5 mL  0.5 mL Intramuscular Tomorrow-1000 Hildred Priest, MD      . simvastatin (ZOCOR) tablet 20 mg  20 mg Oral q1800 Hildred Priest, MD   20 mg at 11/25/14 1802  . traZODone (DESYREL) tablet 100 mg  100 mg Oral QHS PRN Hildred Priest, MD   100 mg at 11/25/14 2152    Lab Results:  Results for orders placed or performed during the hospital encounter of 11/24/14 (from the past 48 hour(s))  TSH      Status: None   Collection Time: 11/25/14  1:46 PM  Result Value Ref Range   TSH 2.581 0.350 - 4.500 uIU/mL  Comprehensive metabolic panel     Status: Abnormal   Collection Time: 11/25/14  1:46 PM  Result Value Ref Range   Sodium 135 135 - 145 mmol/L   Potassium 4.0 3.5 - 5.1 mmol/L   Chloride 98 (L) 101 - 111 mmol/L   CO2 29 22 - 32 mmol/L   Glucose, Bld 98 65 - 99 mg/dL   BUN 19 6 - 20 mg/dL   Creatinine, Ser 0.97 0.61 - 1.24 mg/dL   Calcium 9.2 8.9 - 10.3 mg/dL   Total Protein 7.5 6.5 - 8.1 g/dL   Albumin 4.3 3.5 - 5.0 g/dL   AST 20 15 - 41 U/L   ALT 20 17 - 63 U/L  Alkaline Phosphatase 41 38 - 126 U/L   Total Bilirubin 0.4 0.3 - 1.2 mg/dL   GFR calc non Af Amer >60 >60 mL/min   GFR calc Af Amer >60 >60 mL/min    Comment: (NOTE) The eGFR has been calculated using the CKD EPI equation. This calculation has not been validated in all clinical situations. eGFR's persistently <60 mL/min signify possible Chronic Kidney Disease.    Anion gap 8 5 - 15  Vitamin B12     Status: None   Collection Time: 11/25/14  3:00 PM  Result Value Ref Range   Vitamin B-12 266 180 - 914 pg/mL    Comment: (NOTE) This assay is not validated for testing neonatal or myeloproliferative syndrome specimens for Vitamin B12 levels. Performed at [facility]     Physical Findings: AIMS: Facial and Oral Movements Muscles of Facial Expression: None, normal Lips and Perioral Area: None, normal Jaw: None, normal Tongue: None, normal,Extremity Movements Upper (arms, wrists, hands, fingers): None, normal Lower (legs, knees, ankles, toes): None, normal, Trunk Movements Neck, shoulders, hips: None, normal, Overall Severity Severity of abnormal movements (highest score from questions above): None, normal Incapacitation due to abnormal movements: None, normal Patient's awareness of abnormal movements (rate only patient's report): No Awareness, Dental Status Current problems with teeth and/or  dentures?: No Does patient usually wear dentures?: No  CIWA:  CIWA-Ar Total: 4 COWS:     Treatment Plan Summary: Daily contact with patient to assess and evaluate symptoms and progress in treatment   Major depressive disorder: We'll continue citalopram 20 mg by mouth daily which was started at the outside facility  Anxiety: We will order Vistaril 50 mg by mouth every 8 hours when necessary for anxiety  Insomnia: Patient will be continued on trazodone I will increase the dose to 150 mg by mouth daily at bedtime when necessary   Hypertension: Blood pressure continues to be elevated. His heart rate is between 60s and 70s. I will increase the dose of hydrochlorothiazide to 25 mg by mouth daily. Continue hydralazine when necessary  every 8 hours when necessary has been ordered for systolic blood pressure greater than 482 or diastolic blood pressure greater than 95.  I will change the patient's diet to low sodium.  Dyslipidemia: Continue simvastatin 20 mg daily   Tobacco use disorder: We will offer the patient a Nicotrol inhaler when necessary  Labs: TSH, vitamin B12 and comprehensive metabolic panel level are within the normal limits. EKG completed yesterday: Normal sinus rhythm, normal EKG   Medical Decision Making:  Established Problem, Stable/Improving (1)     Hildred Priest 11/26/2014, 9:56 AM

## 2014-11-26 NOTE — Progress Notes (Signed)
Continues to have a sad affect. Rates depression and anxiety as a 6.   Denies SI although continues to have thoughts of hopelessness.  Medication and group compliant.  Visible in milieu, no inappropriate behavior noted.

## 2014-11-26 NOTE — Plan of Care (Signed)
Problem: Ineffective individual coping Goal: STG: Patient will remain free from self harm Outcome: Progressing Patient remains free of injury, safe on the unit.

## 2014-11-26 NOTE — Tx Team (Signed)
Interdisciplinary Treatment Plan Update (Adult)  Date:  11/26/2014 Time Reviewed:  8:35 AM  Progress in Treatment: Attending groups: Yes. Participating in groups:  Yes. Taking medication as prescribed:  Yes. Tolerating medication:  Yes. Family/Significant othe contact made:  No, will contact:    Patient understands diagnosis:  Yes. Discussing patient identified problems/goals with staff:  Yes. Medical problems stabilized or resolved:  Yes. Denies suicidal/homicidal ideation: Yes. Issues/concerns per patient self-inventory:  Yes. Other:  New problem(s) identified: No, Describe:     Discharge Plan or Barriers:  Reason for Continuation of Hospitalization: Suicidal ideation  Comments: patient is detoxing and will need follow up care in [place]  Estimated length of stay: 5 day  New goal(s):  Review of initial/current patient goals per problem list:   Refer to plan of care  Attendees: Patient:  Dylan Hughes 5/19/20168:35 AM  Family:   5/19/20168:35 AM  Physician:  Dr Ardyth HarpsHernandez 5/19/20168:35 AM  Nursing:    5/19/20168:35 AM  Case Manager:   5/19/20168:35 AM  Counselor:   5/19/20168:35 AM  Other:  [name] 5/19/20168:35 AM  Other:  [name] 5/19/20168:35 AM  Other:  [name] 5/19/20168:35 AM  Other:  5/19/20168:35 AM  Other:  5/19/20168:35 AM  Other:  5/19/20168:35 AM  Other:  5/19/20168:35 AM  Other:  5/19/20168:35 AM  Other:  5/19/20168:35 AM  Other:   5/19/20168:35 AM   Scribe for Treatment Team:   [name], [name], 11/26/2014, 8:35 AM

## 2014-11-26 NOTE — [facility] Group Notes (Signed)
[facility] Group Notes:  (Nursing/MHT/Case Management/Adjunct)  Date:  11/26/2014  Time:  2:50 PM  Type of Therapy:  Group Therapy  Participation Level:  Did Not Attend  Participation Quality:  none  Affect:  none  Cognitive:  none  Insight:  None  Engagement in Group:  none  Modes of Intervention:  none  Summary of Progress/Problems:  Mayra NeerJackie L Masaki Rothbauer 11/26/2014, 2:50 PM

## 2014-11-26 NOTE — Tx Team (Signed)
Interdisciplinary Treatment Plan Update (Adult)  Date:  11/26/2014 Time Reviewed:  10:27 AM  Progress in Treatment: Attending groups: Yes. Participating in groups:  Yes. Taking medication as prescribed:  Yes. Tolerating medication:  Yes. Family/Significant othe contact made:  No, will contact:  family per pt consent Patient understands diagnosis:  Yes. Discussing patient identified problems/goals with staff:  Yes. Medical problems stabilized or resolved:  Yes. Denies suicidal/homicidal ideation: Yes. Issues/concerns per patient self-inventory:  No. Other:  New problem(s) identified: No, Describe:  none  Discharge Plan or Barriers: Home, [facility] [place]  Reason for Continuation of Hospitalization: Depression Medication stabilization  Comments:  Estimated length of stay: Up to 4 days  New goal(s):  Review of initial/current patient goals per problem list:   See care plan  Attendees: Patient:   5/19/201610:27 AM  Family:   5/19/201610:27 AM  Physician:  Jimmy FootmanAndrea Hernandez-Gonzalez, MD 5/19/201610:27 AM  Nursing:    5/19/201610:27 AM  Case Manager:   5/19/201610:27 AM  Counselor:   5/19/201610:27 AM  Other:  Rojelio BrennerLee Ann Rouse, PsyD 5/19/201610:27 AM  Other:  Beryl MeagerJason Ingle, LCSWA 5/19/201610:27 AM  Other:  Delorse LekLynn Kathee Tumlin, LCSW 5/19/201610:27 AM  Other: Jake SharkSara Laws, LCSW 5/19/201610:27 AM  Other: Hershal CoriaBeth Greene, LRT 5/19/201610:27 AM  Other:  5/19/201610:27 AM  Other:  5/19/201610:27 AM  Other:  5/19/201610:27 AM  Other:  5/19/201610:27 AM  Other:   5/19/201610:27 AM   Scribe for Treatment Team:   [place]ynn Johnson [place], LCSW [phone]  [place], Dardenne PrairieLynetta D, 11/26/2014, 10:27 AM

## 2014-11-26 NOTE — Plan of Care (Signed)
Problem: [facility] Participation in Recreation Therapeutic Interventions Goal: STG-Patient will demonstrate improved self esteem by identif STG: Self-Esteem - Within 5 treatment sessions, patient will verbalize at least 5 positive affirmation statements in each of 3 treatment sessions to increase self-esteem post d/c.  Outcome: Progressing Treatment Session 1; Completed 1 out of 2: At approximately 11:45 am, LRT met with patient in craft room. Patient verbalized 5 positive affirmation statements. Patient reported it felt "bland". LRT encouraged patient to continue saying positive affirmation statements.  Leonette Monarch, LRT/CTRS 05.19.16 1:45 pm Goal: STG-Patient will identify at least five coping skills for ** STG: Coping Skills - Within 4 treatment sessions, patient will verbalize at least 5 coping skills for substance abuse in each of 2 treatment sessions to decrease substance abuse post d/c.  Outcome: Progressing Treatment Session 1; Completed 1 out of 2: At approximately 11:45 am, LRT met with patient in craft room. Patient verbalized 5 coping skills for substance abuse. LRT educated patient on leisure and why it is important to implement into his schedule. LRT provided patient with blank schedules to help him plan his day and try to avoid using substances. LRT educated patient on healthy support systems and why they are important.  Leonette Monarch, LRT/CTRS 05.19.16 1:47 pm Goal: STG-Other Recreation Therapy Goal (Specify) STG: Stress Management - Within 5 treatment sessions, patient will demonstrate at least one stress management technique in each of 2 treatment sessions to increase stress management skills post d/c.  Outcome: Progressing Treatment Session 1; Completed 0 out of 2: At approximately 11:45 am, LRT met with patient in craft room. LRT educated and provided patient with handouts on stress management techniques. Patient verbalized understanding. LRT encouraged patient to read over  and practice stress management techniques.  Leonette Monarch, LRT/CTRS 05.19.16 1:50 pm

## 2014-11-26 NOTE — [facility] Group Notes (Signed)
[facility] Group Notes:  (Nursing/MHT/Case Management/Adjunct)  Date:  11/26/2014  Time:  9:40 AM  Type of Therapy:  Community Meeting   Participation Level:  Active  Participation Quality:  Appropriate and Attentive  Affect:  Appropriate  Cognitive:  Appropriate  Insight:  Appropriate  Engagement in Group:  Engaged  Modes of Intervention:  Discussion  Summary of Progress/Problems:  Dylan Hughes De'Chelle Jeroline Wolbert 11/26/2014, 9:40 AM

## 2014-11-26 NOTE — [facility] Group Notes (Signed)
[facility] LCSW Group Therapy  11/26/2014 3:47 PM  Type of Therapy:  Group Therapy  Participation Level:  Active  Participation Quality:  Appropriate and Attentive  Affect:  Appropriate  Cognitive:  Alert, Appropriate and Oriented  Insight:  Engaged  Engagement in Therapy:  Engaged  Modes of Intervention:  Socialization and Support  Summary of Progress/Problems: Pt attended group and participated appropriately. Pt shared that he gets joy from "my 2 kids".   Beryl Meagerngle, Rodgers Likes T 11/26/2014, 3:47 PM

## 2014-11-27 MED ORDER — NICOTINE 10 MG IN INHA: 1.0000 | RESPIRATORY_TRACT | Status: DC | PRN

## 2014-11-27 MED ORDER — SIMVASTATIN 20 MG PO TABS: 20.0000 mg | ORAL_TABLET | Freq: Every day | ORAL | Status: DC

## 2014-11-27 MED ORDER — TRAZODONE HCL 100 MG PO TABS: 100.0000 mg | ORAL_TABLET | Freq: Every evening | ORAL | Status: DC | PRN

## 2014-11-27 MED ORDER — CITALOPRAM HYDROBROMIDE 20 MG PO TABS: 20.0000 mg | ORAL_TABLET | Freq: Every day | ORAL | Status: DC

## 2014-11-27 MED ORDER — HYDROCHLOROTHIAZIDE 25 MG PO TABS: 25.0000 mg | ORAL_TABLET | Freq: Every day | ORAL | Status: DC

## 2014-11-27 NOTE — [facility] Group Notes (Signed)
[facility]  11/27/2014 1:49 PM  Participation Quality:  Appropriate, Attentive and Supportive  Affect:  Appropriate  Cognitive:  Alert, Appropriate and Oriented  Insight:  Engaged  Engagement in Group:  Engaged  Modes of Intervention:  Education, Socialization and Support  Summary of Progress/Problems: Pt attended group and partciapated appropriately in discussion. Pt reports that his SMART goal is to "relax".  Pt shared lots of insight to his experience with 'relapse' and his plans for 'relapse prevention' once he is discharged from the hospital.   Beryl Meagerngle, Odessia Asleson T 11/27/2014, 1:49 PM

## 2014-11-27 NOTE — Progress Notes (Signed)
Recreation Therapy Notes  Date: 05.20.16 Time: 3:00 pm Location: Craft Room  Group Topic: Problem solving, Communication, Teamwork  Goal Area(s) Addresses:  Patient will effectively work with peer towards shared goal. Patient will identify skills used to make activity successful. Patient will identify benefit of using group skills effectively post d/c.  Behavioral Response: Attentive, Interactive  Intervention: [employer] Tower  Activity: Patients were given 15 pipe cleaners and instructed to build the Berkshire Hathawaytallest freestanding tower. After about 5 minutes, LRT told patients they had to put their dominant hand behind their back. After 3 minutes, LRT instructed patients that they could not talk to each other.  Education:LRT educated patient on communication, problem solving, and teamwork and how those skills relate to healthy support systems.  Education Outcome: Acknowledges education/In group clarification offered  Clinical Observations/Feedback: Patient worked with peers to build tower. Patient used effective communication, problem solving, and teamwork. Patient contributed to group discussion by stating how his team worked effectively together, how they used communication, problem solving, and teamwork effectively, how these skills are related to building a healthy support system, how he felt after he used these skills in group, what prevents him from using these skills at home, and what would change if he started using these skills effectively at home.   Jacquelynn CreeGreene,Miarose Lippert M, LRT/CTRS 11/27/2014 4:24 PM

## 2014-11-27 NOTE — [facility] Group Notes (Signed)
[facility] Group Notes:  (Nursing/MHT/Case Management/Adjunct)  Date:  11/27/2014  Time:  11:53 AM  Type of Therapy:  Group Therapy  Participation Level:  Active  Participation Quality:  Appropriate and Attentive  Affect:  Appropriate  Cognitive:  Alert, Appropriate and Oriented  Insight:  Appropriate  Engagement in Group:  Engaged  Modes of Intervention:  Activity  Summary of Progress/Problems:  Keilin Gamboa De'Chelle Zakiah Gauthreaux 11/27/2014, 11:53 AM

## 2014-11-27 NOTE — Progress Notes (Signed)
Pleasant and cooperative.  Smiles easily when approached.  Visible in the milieu.  Verbalizes that he is feeling much better.  Medication and group compliant.  Maintaining personal care chores appropriately.

## 2014-11-27 NOTE — Progress Notes (Signed)
Alert and oriented x 4, affect is flat, sad and labile, denies SI/HI, no acute distress noted interacting appropriately with peers and staff, 15 minutes checks maintained will continue to monitor.

## 2014-11-27 NOTE — Plan of Care (Signed)
Problem: Ineffective individual coping Goal: STG:Pt. will utilize relaxation techniques to reduce stress STG: Patient will utilize relaxation techniques to reduce stress levels  Outcome: Progressing Patient appears less anxious interacting with peers appropriately.

## 2014-11-27 NOTE — Progress Notes (Signed)
[facility] MD Progress Note  11/27/2014 12:28 PM Dylan Hughes  MRN:  [id number] Subjective:  Patient reports feeling much calmer today. States that after talking about his problems just today with this Probation officer and with the recreational therapies he came out with a plan on how to confront he's landlord and his downstairs neighbor. He plans to write things down while he is here in practice in order not to come across as aggressive or threatening.  Patient continues to feel depressed but denies suicidality, homicidality or having auditory or visual hallucinations. Denies side effects from medications. Reports that he slept well with the help of the trazodone. Denies having issues with him appetite or energy and concentration is limited and he is still feeling overwhelmed by all his as stressors. Denies major physical complaints but then reported having headaches.  Patient requested medication in order to help him quit drinking. He reported interest in trying Antabuse.  Per nursing: Patient is still reporting depression and hopelessness.  Principal Problem: Major depressive disorder, recurrent, severe without psychotic features Diagnosis:   Patient Active Problem List   Diagnosis Date Noted  . Major depressive disorder, recurrent, severe without psychotic features [F33.2]   . Essential hypertension [I10]   . Alcohol use disorder, severe, dependence [F10.20] 11/25/2014  . Tobacco use disorder [Z72.0] 11/25/2014  . Social anxiety disorder [F40.10] 11/25/2014  . Dyslipidemia [E78.5] 11/25/2014   Total Time spent with patient: 30 minutes   Past Medical History:  Past Medical History  Diagnosis Date  . Hypertension   . History of high cholesterol    History reviewed. No pertinent past surgical history. Family History: History reviewed. No pertinent family history. Social History:  History  Alcohol Use  . 16.8 oz/week  . 8 Glasses of wine, 12 Cans of beer, 8 Shots of liquor per week    Comment:  Every other day.     History  Drug Use No    History   Social History  . Marital Status: Single    Spouse Name: N/A  . Number of Children: N/A  . Years of Education: N/A   Social History Main Topics  . Smoking status: Current Every Day Smoker -- 1.00 packs/day for 20 years    Types: Cigarettes  . Smokeless tobacco: Not on file  . Alcohol Use: 16.8 oz/week    8 Glasses of wine, 12 Cans of beer, 8 Shots of liquor per week     Comment: Every other day.  . Drug Use: No  . Sexual Activity: Yes   Other Topics Concern  . None   Social History Narrative   Additional History:    Sleep: Fair  Appetite:  Good   Assessment:   Musculoskeletal: Strength & Muscle Tone: within normal limits Gait & Station: normal Patient leans: N/A   Psychiatric Specialty Exam: Physical Exam   Review of Systems  Respiratory: Negative for cough.   Cardiovascular: Negative for chest pain.  Gastrointestinal: Negative for nausea, vomiting, abdominal pain and diarrhea.  Skin: Negative for rash.  Neurological: Positive for headaches.  Psychiatric/Behavioral: Positive for depression and substance abuse. Negative for suicidal ideas and hallucinations. The patient is nervous/anxious. The patient does not have insomnia.     Blood pressure 115/81, pulse 63, temperature 98.1 F (36.7 C), temperature source Oral, resp. rate 20, height 5' 2.01" (1.575 m), weight 53.978 kg (119 lb), SpO2 98 %.Body mass index is 21.76 kg/(m^2).  General Appearance: Well Groomed  Eye Contact::  Good  Speech:  Normal  Rate  Volume:  Normal  Mood:  Dysphoric  Affect:  Constricted  Thought Process:  Linear  Orientation:  Full (Time, Place, and Person)  Thought Content:  Hallucinations: None  Suicidal Thoughts:  No  Homicidal Thoughts:  No  Memory:  Immediate;   Good Recent;   Good Remote;   Good  Judgement:  Poor  Insight:  Fair  Psychomotor Activity:  Decreased  Concentration:  Good  Recall:  NA  Fund of  Knowledge:Good  Language: Good  Akathisia:  No  Handed:    AIMS (if indicated):     Assets:  Communication Skills Desire for Improvement Housing Physical Health Social Support Vocational/Educational  ADL's:  Intact  Cognition: WNL  Sleep:  Number of Hours: 7.5     Current Medications: Current Facility-Administered Medications  Medication Dose Route Frequency Provider Last Rate Last Dose  . acetaminophen (TYLENOL) tablet 650 mg  650 mg Oral Q6H PRN Hildred Priest, MD   650 mg at 11/24/14 2233  . alum & mag hydroxide-simeth (MAALOX/MYLANTA) 200-200-20 MG/5ML suspension 30 mL  30 mL Oral Q4H PRN Hildred Priest, MD      . citalopram (CELEXA) tablet 20 mg  20 mg Oral Daily Hildred Priest, MD   20 mg at 11/27/14 0919  . hydrALAZINE (APRESOLINE) tablet 25 mg  25 mg Oral TID PRN Hildred Priest, MD   25 mg at 11/25/14 2152  . hydrochlorothiazide (HYDRODIURIL) tablet 25 mg  25 mg Oral Daily Hildred Priest, MD   25 mg at 11/27/14 0919  . hydrOXYzine (ATARAX/VISTARIL) tablet 50 mg  50 mg Oral TID PRN Hildred Priest, MD   50 mg at 11/25/14 2153  . ibuprofen (ADVIL,MOTRIN) tablet 600 mg  600 mg Oral Q6H PRN Hildred Priest, MD   600 mg at 11/27/14 0919  . magnesium hydroxide (MILK OF MAGNESIA) suspension 30 mL  30 mL Oral Daily PRN Hildred Priest, MD      . nicotine (NICOTROL) 10 MG inhaler 1 continuous puffing  1 continuous puffing Inhalation PRN Hildred Priest, MD   1 continuous puffing at 11/25/14 1803  . simvastatin (ZOCOR) tablet 20 mg  20 mg Oral q1800 Hildred Priest, MD   20 mg at 11/26/14 1710  . traZODone (DESYREL) tablet 100 mg  100 mg Oral QHS PRN Hildred Priest, MD   100 mg at 11/25/14 2152    Lab Results:  Results for orders placed or performed during the hospital encounter of 11/24/14 (from the past 48 hour(s))  TSH     Status: None   Collection Time: 11/25/14   1:46 PM  Result Value Ref Range   TSH 2.581 0.350 - 4.500 uIU/mL  Comprehensive metabolic panel     Status: Abnormal   Collection Time: 11/25/14  1:46 PM  Result Value Ref Range   Sodium 135 135 - 145 mmol/L   Potassium 4.0 3.5 - 5.1 mmol/L   Chloride 98 (L) 101 - 111 mmol/L   CO2 29 22 - 32 mmol/L   Glucose, Bld 98 65 - 99 mg/dL   BUN 19 6 - 20 mg/dL   Creatinine, Ser 0.97 0.61 - 1.24 mg/dL   Calcium 9.2 8.9 - 10.3 mg/dL   Total Protein 7.5 6.5 - 8.1 g/dL   Albumin 4.3 3.5 - 5.0 g/dL   AST 20 15 - 41 U/L   ALT 20 17 - 63 U/L   Alkaline Phosphatase 41 38 - 126 U/L   Total Bilirubin 0.4 0.3 - 1.2 mg/dL  GFR calc non Af Amer >60 >60 mL/min   GFR calc Af Amer >60 >60 mL/min    Comment: (NOTE) The eGFR has been calculated using the CKD EPI equation. This calculation has not been validated in all clinical situations. eGFR's persistently <60 mL/min signify possible Chronic Kidney Disease.    Anion gap 8 5 - 15  Vitamin B12     Status: None   Collection Time: 11/25/14  3:00 PM  Result Value Ref Range   Vitamin B-12 266 180 - 914 pg/mL    Comment: (NOTE) This assay is not validated for testing neonatal or myeloproliferative syndrome specimens for Vitamin B12 levels. Performed at [facility]     Physical Findings: AIMS: Facial and Oral Movements Muscles of Facial Expression: None, normal Lips and Perioral Area: None, normal Jaw: None, normal Tongue: None, normal,Extremity Movements Upper (arms, wrists, hands, fingers): None, normal Lower (legs, knees, ankles, toes): None, normal, Trunk Movements Neck, shoulders, hips: None, normal, Overall Severity Severity of abnormal movements (highest score from questions above): None, normal Incapacitation due to abnormal movements: None, normal Patient's awareness of abnormal movements (rate only patient's report): No Awareness, Dental Status Current problems with teeth and/or dentures?: No Does patient usually wear  dentures?: No  CIWA:  CIWA-Ar Total: 4 COWS:     Treatment Plan Summary: Daily contact with patient to assess and evaluate symptoms and progress in treatment   Major depressive disorder: We'll continue citalopram 20 mg by mouth daily which was started at the outside facility  Anxiety: We will order Vistaril 50 mg by mouth every 8 hours when necessary for anxiety  Alcohol use disorder: AST and ALT are within the normal limits. Patient is interested in a trial of Antabuse. We'll give prescription for 250 mg to use every other day at discharge.  Insomnia: Patient will be continued on trazodone I will increase the dose to 150 mg by mouth daily at bedtime when necessary   Hypertension: Hydrochlorothiazide was increased yesterday to 25 mg by mouth daily. Continue hydralazine when necessary  every 8 hours when necessary has been ordered for systolic blood pressure greater than 005 or diastolic blood pressure greater than 95.  Continue low sodium diet  Dyslipidemia: Continue simvastatin 20 mg daily   Tobacco use disorder: We will offer the patient a Nicotrol inhaler when necessary  Labs: TSH, vitamin B12 and comprehensive metabolic panel level are within the normal limits. EKG completed yesterday: Normal sinus rhythm, normal EKG  Discharge planning: Patient will be observed over the weekend if he does okay with plan to discharge early next week.  [facility] as they have a intensive outpatient substance abuse treatment.  Medical Decision Making:  Established Problem, Stable/Improving (1)     Hildred Priest 11/27/2014, 12:28 PM

## 2014-11-27 NOTE — Plan of Care (Signed)
Problem: [facility] Participation in Recreation Therapeutic Interventions Goal: STG-Patient will demonstrate improved self esteem by identif STG: Self-Esteem - Within 5 treatment sessions, patient will verbalize at least 5 positive affirmation statements in each of 3 treatment sessions to increase self-esteem post d/c.  Outcome: Progressing Treatment Session 2; Completed 2 out of 3: At approximately 11:50 am, LRT met with patient in craft room. Patient verbalized 5 positive affirmation statements. Patient reported it felt "better". LRT encouraged patient to continue saying positive affirmation statements.  Leonette Monarch, LRT/CTRS 05.20.16 1:36 pm Goal: STG-Patient will identify at least five coping skills for ** STG: Coping Skills - Within 4 treatment sessions, patient will verbalize at least 5 coping skills for substance abuse in each of 2 treatment sessions to decrease substance abuse post d/c.  Outcome: Completed/Met Date Met:  11/27/14 Treatment Session 2; Completed 2 out of 2: At approximately 11:50 am, LRT met with patient in craft room. Patient verbalized 5 coping skills for substance abuse. Patient reported he did not have any questions.  Leonette Monarch, LRT/CTRS 05.20.16 1:38 pm Goal: STG-Other Recreation Therapy Goal (Specify) STG: Stress Management - Within 5 treatment sessions, patient will demonstrate at least one stress management technique in each of 2 treatment sessions to increase stress management skills post d/c.  Outcome: Progressing Treatment Session 2; Completed 1 out of 2: At approximately 11:50 am, LRT met with patient in craft room. Patient demonstrated one stress management technique. LRT encouraged patient to continue practicing the stress management techniques.  Leonette Monarch, LRT/CTRS 05.20.16 1:40 pm

## 2014-11-28 NOTE — [facility] Group Notes (Signed)
[facility] Group Notes:  (Nursing/MHT/Case Management/Adjunct)  Date:  11/28/2014  Time:  10:20 PM  Type of Therapy:  Group Therapy  Participation Level:  Active  Participation Quality:  Appropriate and Attentive  Affect:  Appropriate  Cognitive:  Appropriate  Insight:  Appropriate  Engagement in Group:  Engaged  Modes of Intervention:  Support  Summary of Progress/Problems:  Murrell ReddenOlivia Briana Morgan-Little 11/28/2014, 10:20 PM

## 2014-11-28 NOTE — [facility] Group Notes (Signed)
[facility]   May 20th,2016 1pm  Type of Therapy:  Group Therapy  Participation Level:  Active  Participation Quality:  Attentive  Affect:  Appropriate  Cognitive:  Appropriate  Insight:  Engaged  Engagement in Therapy:  Developing/Improving  Modes of Intervention:  Clarification, Exploration and Problem-solving  Summary of Progress/Problems: Group to topic today was discussing and sharing ideas involving community resources and what has worked in the past for patients. This patient was insightful and helpful to his peers. LCSW will provide patient with resources he can access in [place] before he discharges.  [name], Dylan Hughes M 11/28/2014, 12:46 PM

## 2014-11-28 NOTE — [facility] Group Notes (Signed)
[facility] Group Notes:  (Nursing/MHT/Case Management/Adjunct)  Date:  11/28/2014  Time:  12:17 AM  Type of Therapy:  Group Therapy  Participation Level:  Active  Participation Quality:  Appropriate  Affect:  Appropriate  Cognitive:  Appropriate  Insight:  Appropriate  Engagement in Group:  Engaged and Supportive  Modes of Intervention:  Support  Summary of Progress/Problems:  Dylan Hughes 11/28/2014, 12:17 AM

## 2014-11-28 NOTE — Plan of Care (Signed)
Problem: Alteration in mood Goal: STG-Patient is able to discuss feelings and issues (Patient is able to discuss feelings and issues leading to depression)  Outcome: Progressing Patient is interacting with staff, and verbalization of feelings encouraged.

## 2014-11-28 NOTE — Progress Notes (Signed)
Pt has been pleasant and cooperative . Some redirecting needed at times. Pt. Denies SI and A/V hallucinations. Pt. Has been active on the unit. Pt has attended most unit activities. Pt's mood and affect has been depressed.

## 2014-11-28 NOTE — Plan of Care (Signed)
Problem: Alteration in mood Goal: LTG-Patient reports reduction in suicidal thoughts (Patient reports reduction in suicidal thoughts and is able to verbalize a safety plan for whenever patient is feeling suicidal)  Outcome: Progressing Patient denies SI/HI, interacting appropriately with peers.

## 2014-11-28 NOTE — [facility] Group Notes (Signed)
[facility] LCSW Group Therapy  11/28/2014 12:41 PM  Type of Therapy:  Group Therapy  Participation Level:  Active  Participation Quality:  Attentive  Affect:  Appropriate  Cognitive:  Alert  Insight:  Engaged  Engagement in Therapy:  Improving  Modes of Intervention:  Discussion, Education, Problem-solving and Support  Summary of Progress/Problems:Group participation was good today. Pt was able to identify his struggles with using substances to self regulate to deal with his emotions. He is now agreeable to try medications that will address his alcoholism and is looking forward to starting.  [name], [place] Marcucci M 11/28/2014, 12:41 PM

## 2014-11-28 NOTE — Progress Notes (Signed)
[facility] MD Progress Note  11/28/2014 4:07 PM Tyreece Gelles  MRN:  [id number] Subjective:  Patient reports feeling much calmer today. States that after talking about his problems just today with this Clinical research associate and with the recreational therapies he came out with a plan on how to confront he's landlord and his downstairs neighbor. He plans to write things down while he is here in practice in order not to come across as aggressive or threatening.  Patient continues to feel depressed but denies suicidality, homicidality or having auditory or visual hallucinations. Denies side effects from medications. Reports that he slept well with the help of the trazodone. Denies having issues with him appetite or energy and concentration is limited and he is still feeling overwhelmed by all his as stressors. Denies major physical complaints but then reported having headaches.  Patient requested medication in order to help him quit drinking. He reported interest in trying Antabuse.  Per nursing: Patient is still reporting depression and hopelessness.  Principal Problem: Major depressive disorder, recurrent, severe without psychotic features Diagnosis:   Patient Active Problem List   Diagnosis Date Noted  . Major depressive disorder, recurrent, severe without psychotic features [F33.2]   . Essential hypertension [I10]   . Alcohol use disorder, severe, dependence [F10.20] 11/25/2014  . Tobacco use disorder [Z72.0] 11/25/2014  . Social anxiety disorder [F40.10] 11/25/2014  . Dyslipidemia [E78.5] 11/25/2014   Total Time spent with patient: 30 minutes   Past Medical History:  Past Medical History  Diagnosis Date  . Hypertension   . History of high cholesterol    History reviewed. No pertinent past surgical history. Family History: History reviewed. No pertinent family history. Social History:  History  Alcohol Use  . 16.8 oz/week  . 8 Glasses of wine, 12 Cans of beer, 8 Shots of liquor per week    Comment: Every  other day.     History  Drug Use No    History   Social History  . Marital Status: Single    Spouse Name: N/A  . Number of Children: N/A  . Years of Education: N/A   Social History Main Topics  . Smoking status: Current Every Day Smoker -- 1.00 packs/day for 20 years    Types: Cigarettes  . Smokeless tobacco: Not on file  . Alcohol Use: 16.8 oz/week    8 Glasses of wine, 12 Cans of beer, 8 Shots of liquor per week     Comment: Every other day.  . Drug Use: No  . Sexual Activity: Yes   Other Topics Concern  . None   Social History Narrative   Additional History:    Sleep: Fair  Appetite:  Good   Assessment:   Musculoskeletal: Strength & Muscle Tone: within normal limits Gait & Station: normal Patient leans: N/A   Psychiatric Specialty Exam: Physical Exam   ROS   Blood pressure 121/82, pulse 114, temperature 98.2 F (36.8 C), temperature source Oral, resp. rate 20, height 5' 2.01" (1.575 m), weight 53.978 kg (119 lb), SpO2 98 %.Body mass index is 21.76 kg/(m^2).  General Appearance: Well Groomed  Patent attorney::  Good  Speech:  Normal Rate  Volume:  Normal  Mood:  Dysphoric  Affect:  Constricted  Thought Process:  Linear  Orientation:  Full (Time, Place, and Person)  Thought Content:  Hallucinations: None  Suicidal Thoughts:  No  Homicidal Thoughts:  No  Memory:  Immediate;   Good Recent;   Good Remote;   Good  Judgement:  Poor  Insight:  Fair  Psychomotor Activity:  Decreased  Concentration:  Good  Recall:  NA  Fund of Knowledge:Good  Language: Good  Akathisia:  No  Handed:    AIMS (if indicated):     Assets:  Communication Skills Desire for Improvement Housing Physical Health Social Support Vocational/Educational  ADL's:  Intact  Cognition: WNL  Sleep:  Number of Hours: 6.75     Current Medications: Current Facility-Administered Medications  Medication Dose Route Frequency Provider Last Rate Last Dose  . acetaminophen (TYLENOL)  tablet 650 mg  650 mg Oral Q6H PRN Jimmy Footman, MD   650 mg at 11/24/14 2233  . alum & mag hydroxide-simeth (MAALOX/MYLANTA) 200-200-20 MG/5ML suspension 30 mL  30 mL Oral Q4H PRN Jimmy Footman, MD      . citalopram (CELEXA) tablet 20 mg  20 mg Oral Daily Jimmy Footman, MD   20 mg at 11/28/14 4401  . hydrALAZINE (APRESOLINE) tablet 25 mg  25 mg Oral TID PRN Jimmy Footman, MD   25 mg at 11/27/14 2202  . hydrochlorothiazide (HYDRODIURIL) tablet 25 mg  25 mg Oral Daily Jimmy Footman, MD   25 mg at 11/28/14 0917  . hydrOXYzine (ATARAX/VISTARIL) tablet 50 mg  50 mg Oral TID PRN Jimmy Footman, MD   50 mg at 11/27/14 2202  . ibuprofen (ADVIL,MOTRIN) tablet 600 mg  600 mg Oral Q6H PRN Jimmy Footman, MD   600 mg at 11/28/14 0920  . magnesium hydroxide (MILK OF MAGNESIA) suspension 30 mL  30 mL Oral Daily PRN Jimmy Footman, MD      . nicotine (NICOTROL) 10 MG inhaler 1 continuous puffing  1 continuous puffing Inhalation PRN Jimmy Footman, MD   1 continuous puffing at 11/25/14 1803  . simvastatin (ZOCOR) tablet 20 mg  20 mg Oral q1800 Jimmy Footman, MD   20 mg at 11/27/14 1724  . traZODone (DESYREL) tablet 100 mg  100 mg Oral QHS PRN Jimmy Footman, MD   100 mg at 11/27/14 2201    Lab Results:  No results found for this or any previous visit (from the past 48 hour(s)).  Physical Findings: AIMS: Facial and Oral Movements Muscles of Facial Expression: None, normal Lips and Perioral Area: None, normal Jaw: None, normal Tongue: None, normal,Extremity Movements Upper (arms, wrists, hands, fingers): None, normal Lower (legs, knees, ankles, toes): None, normal, Trunk Movements Neck, shoulders, hips: None, normal, Overall Severity Severity of abnormal movements (highest score from questions above): None, normal Incapacitation due to abnormal movements: None, normal Patient's  awareness of abnormal movements (rate only patient's report): No Awareness, Dental Status Current problems with teeth and/or dentures?: No Does patient usually wear dentures?: No  CIWA:  CIWA-Ar Total: 4 COWS:     Treatment Plan Summary: Daily contact with patient to assess and evaluate symptoms and progress in treatment   Major depressive disorder: We'll continue citalopram 20 mg by mouth daily which was started at the outside facility  Anxiety: We will order Vistaril 50 mg by mouth every 8 hours when necessary for anxiety  Alcohol use disorder: AST and ALT are within the normal limits. Patient is interested in a trial of Antabuse. We'll give prescription for 250 mg to use every other day at discharge.  Insomnia: Patient will be continued on trazodone I will increase the dose to 150 mg by mouth daily at bedtime when necessary   Hypertension: Hydrochlorothiazide was increased yesterday to 25 mg by mouth daily. Continue hydralazine when necessary  every 8 hours  when necessary has been ordered for systolic blood pressure greater than 155 or diastolic blood pressure greater than 95.  Continue low sodium diet  Dyslipidemia: Continue simvastatin 20 mg daily   Tobacco use disorder: We will offer the patient a Nicotrol inhaler when necessary  Labs: TSH, vitamin B12 and comprehensive metabolic panel level are within the normal limits. EKG completed yesterday: Normal sinus rhythm, normal EKG  Discharge planning: Patient will be observed over the weekend if he does okay with plan to discharge early next week.  [facility] [place] [facility] substance abuse treatment.  Medical Decision Making:  Established Problem, Stable/Improving (1)  Patient is neatly dressed and groomed today and states that he is feeling good. He says his mood is much better and he feels much calmer and more relaxed about his living situation. He denies any  suicidal ideation or homicidal ideation. Denies any hallucinations. Vital signs are stable. Doesn't appear to be having physical signs of withdrawal. No acute change to treatment plan.     CLAPACS, JOHN 11/28/2014, 4:07 PM
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
## 2014-11-29 NOTE — Progress Notes (Signed)
D: Pt denies SI/HI/AVH and pain at this time . Pt is pleasant and cooperative. Pt only concern was his Bp which was elevated and pt was given PRN Hydralazine. Pt not forthcoming with information, but pt will talk to Clinical research associatewriter. Pt appears to be in denial about the severity of his situation.   A: Pt was offered support and encouragement. Pt was given scheduled medications. Pt was encourage to attend groups. Q 15 minute checks were done for safety.   R:Pt attends groups and interacts well with peers and staff. Pt is taking medication. Pt has no complaints at this time .Pt receptive to treatment and safety maintained on unit.

## 2014-11-29 NOTE — Progress Notes (Signed)
Patient is alert and oriented. He has a flat affect but good eye contact.  Patient reports that he does not have any thoughts of self harm at this time.  He has been noted to be sitting in the day room interacting with peers. Will cont to monitor for safety.

## 2014-11-29 NOTE — [facility] Group Notes (Signed)
[facility] LCSW Group Therapy  11/29/2014 1:01 PM  Type of Therapy:  Group Therapy  Participation Level:  Active  Participation Quality:  Attentive  Affect:  Appropriate  Cognitive:  Appropriate  Insight:  Improving  Engagement in Therapy:  Developing/Improving  Modes of Intervention:  Discussion, Exploration and Problem-solving  Summary of Progress/Problems: LCSW introduced group topic of holding onto grudges and was able to follow and relate his own personal struggles and shared how he would deal with the emotions attached. He stated he has learned to let go of some things but still is challenged by the past. He hopes to improve this with time. LCSW supported patient after group.  [name], Dylan Hughes 11/29/2014, 1:01 PM
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
## 2014-11-29 NOTE — Plan of Care (Signed)
Problem: Ineffective individual coping Goal: LTG: Patient will report a decrease in negative feelings Outcome: Progressing Pt denies any bad feelings at this time Goal: STG: Patient will remain free from self harm Outcome: Progressing Pt remains safe on the unit  Problem: Alteration in mood Goal: LTG-Pt's behavior demonstrates decreased signs of depression (Patient's behavior demonstrates decreased signs of depression to the point the patient is safe to return home and continue treatment in an outpatient setting)  Outcome: Progressing Pt looked for writer to talk and appeared to be in a happy  Mood during the assessment

## 2014-11-30 MED ORDER — DISULFIRAM 250 MG PO TABS: 250.0000 mg | ORAL_TABLET | Freq: Every day | ORAL | Status: DC

## 2014-11-30 NOTE — Progress Notes (Signed)
Alert and oriented x 4, affect is flat but brightens upon approach, no acute distress noted, interacting appropriately with peers and staff, complaint with treatment plan, scheduled for discharge tomorrow, 15 minutes checks maintained will continue to monitor.

## 2014-11-30 NOTE — Progress Notes (Signed)
AVS H&P Discharge Summary faxed to [facility] for hospital follow-up

## 2014-11-30 NOTE — [facility] Suicide Risk Assessment (Signed)
[facility]   Demographic Factors:  Male, Divorced or widowed, Caucasian, Low socioeconomic status and Living alone  Total Time spent with patient: 30 minutes  Musculoskeletal: Strength & Muscle Tone: within normal limits Gait & Station: normal Patient leans: N/A  Psychiatric Specialty Exam: Physical Exam  ROS  Blood pressure 108/77, pulse 93, temperature 98.4 F (36.9 C), temperature source Oral, resp. rate 20, height 5' 2.01" (1.575 m), weight 53.978 kg (119 lb), SpO2 98 %.Body mass index is 21.76 kg/(m^2).                                                       Have you used any form of tobacco in the last 30 days? (Cigarettes, Smokeless Tobacco, Cigars, and/or Pipes): Yes  Has this patient used any form of tobacco in the last 30 days? (Cigarettes, Smokeless Tobacco, Cigars, and/or Pipes) Yes, A prescription for an FDA-approved tobacco cessation medication was offered at discharge and the patient refused  Mental Status Per Nursing Assessment::   On Admission:  NA  Current Mental Status by Physician: Denies suicidal ideation homicidal ideation or auditory or visual hallucinations. The patient is hopeful and future oriented. Motivated for treatment  Loss Factors: Legal issues and Financial problems/change in socioeconomic status  Historical Factors: Prior suicide attempts  Risk Reduction Factors:   Sense of responsibility to family, Employed and Positive social support  Continued Clinical Symptoms:  Depression:   Comorbid alcohol abuse/dependence Alcohol/Substance Abuse/Dependencies  Cognitive Features That Contribute To Risk:  None    Suicide Risk:  Minimal: No identifiable suicidal ideation.  Patients presenting with no risk factors but with morbid ruminations; may be classified as minimal risk based on the severity of the depressive symptoms  Principal Problem: Major depressive disorder, recurrent, severe without  psychotic features Discharge Diagnoses:  Patient Active Problem List   Diagnosis Date Noted  . Major depressive disorder, recurrent, severe without psychotic features [F33.2]   . Essential hypertension [I10]   . Alcohol use disorder, severe, dependence [F10.20] 11/25/2014  . Tobacco use disorder [Z72.0] 11/25/2014  . Social anxiety disorder [F40.10] 11/25/2014  . Dyslipidemia [E78.5] 11/25/2014      Plan Of Care/Follow-up recommendations:  Other:  Follow-up with outpatient psychiatrist and therapy  Is patient on multiple antipsychotic therapies at discharge:  No   Has Patient had three or more failed trials of antipsychotic monotherapy by history:  No  Recommended Plan for Multiple Antipsychotic Therapies: NA    Jimmy FootmanHernandez-Gonzalez,  Briony Parveen 11/30/2014, 7:54 AM

## 2014-11-30 NOTE — Plan of Care (Signed)
Problem: [facility] Participation in Recreation Therapeutic Interventions Goal: STG-Patient will demonstrate improved self esteem by identif STG: Self-Esteem - Within 5 treatment sessions, patient will verbalize at least 5 positive affirmation statements in each of 3 treatment sessions to increase self-esteem post d/c.  Outcome: Completed/Met Date Met:  11/30/14 Treatment Session 3; Completed 3 out of 3: At approximately 11:00 am, LRT met with patient in craft room. Patient verbalized 5 positive affirmation statements. Patient reported it is "easier and easier" and it does not feel "corny". Patient reported he felt better about himself today than when he was first admitted and he thought the positive affirmation statements helped. LRT encouraged patient to continue saying positive affirmation statements.  Leonette Monarch, LRT/CTRS 05.23.16 11:18 am Goal: STG-Other Recreation Therapy Goal (Specify) STG: Stress Management - Within 5 treatment sessions, patient will demonstrate at least one stress management technique in each of 2 treatment sessions to increase stress management skills post d/c.  Outcome: Completed/Met Date Met:  11/30/14 Treatment Session 3; Completed 2 out of 2: At approximately 11:00 am, LRT met with patient in craft room. Patient demonstrated one stress management technique. LRT encouraged patient to continue practicing the stress management techniques.  Leonette Monarch, LRT/CTRS 05.23.16 11:20 am

## 2014-11-30 NOTE — [facility] Group Notes (Signed)
[facility]  11/30/2014 11:01 AM  Participation Quality:  Appropriate  Affect:  Appropriate  Cognitive:  Appropriate  Insight:  Engaged  Engagement in Group:  Engaged  Modes of Intervention:  Discussion and Problem-solving  Summary of Progress/Problems:Patient goal is to keep his cool when he returns home  Rocky Boy WestBandi, [name] 11/30/2014, 11:01 AM

## 2014-11-30 NOTE — Progress Notes (Deleted)
  [facility] :  Will you be returning to the same living situation after discharge:  Yes,    At discharge, do you have transportation home?: Yes,  [place]rif dept [place] Do you have the ability to pay for your medications: Yes,     Release of information consent forms completed and in the chart;  Patient's signature needed at discharge.  Patient to Follow up at: Follow-up Information    Follow up with [facility]. Go in 1 week.   Why:  Hospital Discharge Follow up Patient is to go to DSS office and apply for medicaid and then go to walk in clinic for assessment and psychiatric follow up appointment.   Contact information:   [address] [place] [id number] [phone] Fax [phone]      Patient denies SI/HI: Yes,       Safety Planning and Suicide Prevention discussed: Yes,     Have you used any form of tobacco in the last 30 days? (Cigarettes, Smokeless Tobacco, Cigars, and/or Pipes): Yes  Has patient been referred to the Quitline?: Patient refused referral  [name], Ordean Fouts M 11/30/2014, 8:02 AM

## 2014-11-30 NOTE — Plan of Care (Signed)
Problem: Alteration in mood Goal: LTG-Patient reports reduction in suicidal thoughts (Patient reports reduction in suicidal thoughts and is able to verbalize a safety plan for whenever patient is feeling suicidal)  Outcome: Progressing Patient interacting appropriately in the milieu, denies SI/HI.  Goal: STG-Patient reports thoughts of self-harm to staff Outcome: Progressing Denies SI/HI, 15 minutes checks maintained.

## 2014-11-30 NOTE — [facility] Suicide Risk Assessment (Signed)
[facility] INPATIENT:  Family/Significant Other Suicide Prevention Education  Suicide Prevention Education:  Education Completed Dylan Hughes  has been identified by the patient as the family member/significant other with whom the patient will be residing, and identified as the person(s) who will aid the patient in the event of a mental health crisis (suicidal ideations/suicide attempt).  With written consent from the patient, the family member/significant other has been provided the following suicide prevention education, prior to the and/or following the discharge of the patient.  The suicide prevention education provided includes the following:  Suicide risk factors  Suicide prevention and interventions  National Suicide Hotline telephone number  [facility] assessment telephone number  [facility] Emergency Assistance 911  [facility] and/or Residential Mobile Crisis Unit telephone number  Request made of family/significant other to:  Remove weapons (e.g., guns, rifles, knives), all items previously/currently identified as safety concern.    Remove drugs/medications (over-the-counter, prescriptions, illicit drugs), all items previously/currently identified as a safety concern.  The family member/significant other verbalizes understanding of the suicide prevention education information provided.  The family member/significant other agrees to remove the items of safety concern listed above.  Dylan Hughes, Dylan Hughes M 11/30/2014, 8:01 AM

## 2014-11-30 NOTE — Progress Notes (Signed)
  [facility]  Will you be returning to the same living situation after discharge:  Yes,  His own apartment At discharge, do you have transportation home?: Yes,  Patient will be transported by [place] sherrif Do you have the ability to pay for your medications: No.  Release of information consent forms completed and in the chart;  Patient's signature needed at discharge.  Patient to Follow up at:   Patient denies SI/HI: Yes,       Safety Planning and Suicide Prevention discussed: Yes,  1-1 discussion occured and suicide prevention and intervention handout provided  Have you used any form of tobacco in the last 30 days? (Cigarettes, Smokeless Tobacco, Cigars, and/or Pipes): Yes  Has patient been referred to the Quitline?: Patient refused referral  [name], Vernie Vinciguerra M 11/30/2014, 7:54 AM

## 2014-11-30 NOTE — Progress Notes (Signed)
Patient denies SI/HI, denies A/V hallucinations. Patient verbalizes understanding of discharge instructions, follow up care and prescriptions. Patient given all belongings from  [employer]7 days medicine given to patient. Patient escorted out by staff, transported by sheriff.

## 2014-11-30 NOTE — Discharge Summary (Signed)
Physician Discharge Summary Note  Patient:  Dylan Hughes is an 39 y.o., male MRN:  [id number] DOB:  [birth year] Patient phone:  [phone] (home)  Patient address:   [address] [place] [address],  Total Time spent with patient: 30 minutes  Date of Admission:  11/24/2014 Date of Discharge: 11/30/2014  Reason for Admission:  Suicidal attempt  Principal Problem: Major depressive disorder, recurrent, severe without psychotic features Discharge Diagnoses: Patient Active Problem List   Diagnosis Date Noted  . Major depressive disorder, recurrent, severe without psychotic features [F33.2]   . Essential hypertension [I10]   . Alcohol use disorder, severe, dependence [F10.20] 11/25/2014  . Tobacco use disorder [Z72.0] 11/25/2014  . Social anxiety disorder [F40.10] 11/25/2014  . Dyslipidemia [E78.5] 11/25/2014    Musculoskeletal: Strength & Muscle Tone: within normal limits Gait & Station: normal Patient leans: N/A  Psychiatric Specialty Exam: Physical Exam  Review of Systems  Constitutional: Negative.   HENT: Negative.   Eyes: Negative.   Respiratory: Negative.   Cardiovascular: Negative.   Gastrointestinal: Negative.   Genitourinary: Negative.   Musculoskeletal: Negative.   Skin: Negative.   Neurological: Negative.   Psychiatric/Behavioral: Negative.     Blood pressure 108/77, pulse 93, temperature 98.4 F (36.9 C), temperature source Oral, resp. rate 20, height 5' 2.01" (1.575 m), weight 53.978 kg (119 lb), SpO2 98 %.Body mass index is 21.76 kg/(m^2).  General Appearance: Well Groomed  Engineer, water::  Good  Speech:  Normal Rate  Volume:  Normal  Mood:  Euthymic  Affect:  Congruent  Thought Process:  Logical  Orientation:  Full (Time, Place, and Person)  Thought Content:  Hallucinations: None  Suicidal Thoughts:  No  Homicidal Thoughts:  No  Memory:  Immediate;   Good Recent;   Good Remote;   Good  Judgement:  Good  Insight:  Good  Psychomotor Activity:   Normal  Concentration:  Good  Recall:  NA  Fund of Knowledge:Good  Language: Good  Akathisia:  No  Handed:  Left  AIMS (if indicated):     Assets:  Communication Skills Desire for Improvement Housing Intimacy Physical Health Talents/Skills Vocational/Educational  ADL's:  Intact  Cognition: WNL  Sleep:  Number of Hours: 7   Have you used any form of tobacco in the last 30 days? (Cigarettes, Smokeless Tobacco, Cigars, and/or Pipes): Yes  Has this patient used any form of tobacco in the last 30 days? (Cigarettes, Smokeless Tobacco, Cigars, and/or Pipes) Yes, A prescription for an FDA-approved tobacco cessation medication was offered at discharge and the patient refused  Level of Care:  OP   History of Present Illness:: Patient is a 39 year old separated Caucasian male currently employed at a [employer] from [employer]. This patient was referred for psychiatric admission by [facility] after a suicidal attempt. On May 12 patient attempted to hang himself with a quart that he had over a close at all in his house. He guesses the cord broke because he was awakened by her [place] police officer hold took him to the emergency room. The police officer reported he had received a phone call from the patient's girlfriend, the patient had called her and told her that he was going to harm himself. When the officer arrived at the house he found the patient lying on the closet floor. He was unconscious, naked and had an empty alcohol container lying right next to him. At arrival to the emergency room his alcohol blood level was 180. Today the  patient reported that he is overwhelmed with his current situation. He lost his driver's license due to DWIs. He is working at [employer] only a few hours a week not making enough money to cover all his bills. He is forced to only eat once a day as he cannot afford more than that. He has refused to go to social services and  apply for food stains because "I don't want any handouts". Patient is currently looking for another job but because he is a felon he is having difficulty finding another employment. He reports a prior history of depression but denies ever being treated for it. The patient has attempted suicide in the past by jumping out of a window when he was a child and cutting his wrists a few years ago. Patient reports his thyroid bleeding in this way but denies wanting to hurt himself now. He realizes how this suicidal attempt has harm his girlfriend and other family members (father and his children).   In terms of substance abuse the patient reports drinking alcohol heavily since he was a teenager. Currently he has been drinking about every other day but can drink a large amount of alcohol. He feels he has a high tolerance for it. In the past he reports having blackouts but denies ever experiencing withdrawals. He denies ever being admitted to detox or rehabilitation. He attended AA only one time. Denies the use of any other illicit substances or abusing prescription medications. As a teenager he used cannabis but has not used it in several years. He smokes about one pack of cigarettes per day.  Patient acknowledged that alcohol plays a big part in exacerbating his depression he feels that he will benefit from treatment for his alcoholism.   Elements: Severity: Severe. Timing: Chronic with acute exacerbation. Duration: 6 days. Context: Alcohol intoxication and multiple social and financial stressors. Associated Signs/Symptoms: Depression Symptoms: depressed mood, suicidal attempt, anxiety, (Hypo) Manic Symptoms: none Anxiety Symptoms: Specific Phobias, patient does describe difficulties with social settings. Identifies himself as shy and introverted. In order to participate in a social activity he feels he is forced to drink first.  Psychotic Symptoms: none PTSD Symptoms: Denies history of  traumatic events Negative Total Time spent with patient: 1 hour   Past psychiatric history: Patient denies prior treatment for mental illness or substance abuse. In 2002 the patient was intoxicated and then cut his wrists which require sutures. Patient was admitted to an inpatient facility for a short period of time (48-72 hours) but as he minimize the symptoms and the severity of his depression that letting go. Patient also reports that at around the age of 54 he jumped out of a window in an attempt to hurt himself he did not suffer any injuries to his parents never knew about this. The patient reports history of self-injurious behaviors he has been cutting himself superficially since the age of 59. He has not done so in a few months. He shows some scars in his abdomen upper arms and legs.  Past Medical History: Patient suffers from hypertension but is not currently taking any medications. He also suffers from dyslipidemia and takes simvastatin 20 mg a day. Denies any surgical history. He denies any history of seizures. He does report having a means to cut accidents with head trauma and loss of consciousness. Patient reports that after the accident he finds remembering means of people or things difficult. Past Medical History  Diagnosis Date  . Hypertension   .  History of high cholesterol    History reviewed. No pertinent past surgical history.  Family History: History reviewed. No pertinent family history. patient denies any history of mental illness in his family. Denies any history of substance abuse in his family. Denies any history of suicidal attempts in his family. Patient's mother passed away of congestive heart failure in 2007. Patient states heart problems run in his family  Social History: This patient is originally from [facility]. 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 Patient remembers being removed from the  custody of his father and mother when he and his sister were very young, around age of 11 he does not remember to recent why. The patient is currently separated from his wife; they had 2 children together who are 66 and 14. Patient's children currently live with the patient's father who has custody of them. . The patient is currently working at [employer]. Has been employed there since February of this year. He does not receive insurance through his employer and is only working 5 hours 3 days a week He lives alone in an apartment in [place]. He has a girlfriend who is supportive. Educational level: Patient completed ninth grade was incarcerated he completed GED. A few years ago patient received multiple certifications at [employer]. . Legal history: Patient was in prison for 3 years. He is a felon due to charges pressed by his wife due to aggravated assault. The patient also had 3 DWIs and currently does not have a driver's license.  History  Alcohol Use  . 16.8 oz/week  . 8 Glasses of wine, 12 Cans of beer, 8 Shots of liquor per week    Comment: Every other day.    History  Drug Use No    History   Social History  . Marital Status: Single    Spouse Name: N/A  . Number of Children: N/A  . Years of Education: N/A   Social History Main Topics  . Smoking status: Current Every Day Smoker -- 1.00 packs/day for 20 years    Types: Cigarettes  . Smokeless tobacco: Not on file  . Alcohol Use: 16.8 oz/week    8 Glasses of wine, 12 Cans of beer, 8 Shots of liquor per week     Comment: Every other day.  . Drug Use: No  . Sexual Activity: Yes         Hospital Course:  Major depressive disorder: We'll continue citalopram 20 mg by mouth daily which was started at the outside facility  Anxiety: We will order Vistaril 50 mg by mouth every 8 hours when necessary for anxiety  Alcohol use disorder:  AST and ALT are within the normal limits. Patient is interested in a trial of Antabuse. We'll give prescription for 250 mg to use every other day at discharge.  Insomnia: Patient will be continued on trazodone I will increase the dose to 150 mg by mouth daily at bedtime when necessary   Hypertension: Hydrochlorothiazide was increased yesterday to 25 mg by mouth daily. Continue hydralazine when necessary every 8 hours when necessary has been ordered for systolic blood pressure greater than 716 or diastolic blood pressure greater than 95. Continue low sodium diet  Dyslipidemia: Continue simvastatin 20 mg daily  Tobacco use disorder: We will offer the patient a Nicotrol inhaler when necessary  Labs: TSH, vitamin B12 and comprehensive metabolic panel level are within  the normal limits. EKG completed yesterday: Normal sinus rhythm, normal EKG  Discharge planning: Patient will be referred to a intensive outpatient substance abuse program in [facility].  On the day of the discharge the patient reported significant improvement in mood. He denied having suicidality, homicidality or auditory or visual hallucinations. Patient denied side effects from medications and denied having any physical complaints. This hospitalization was uneventful. There were no behavioral problems or need for seclusion, restraints or forced medication. The patient dissipated actively in programming.  Consults:  None  Significant Diagnostic Studies:  None  Discharge Vitals:   Blood pressure 108/77, pulse 93, temperature 98.4 F (36.9 C), temperature source Oral, resp. rate 20, height 5' 2.01" (1.575 m), weight 53.978 kg (119 lb), SpO2 98 %. Body mass index is 21.76 kg/(m^2). Lab Results:    Results for AMISH, MINTZER (MRN [id number]) as of 11/30/2014 08:03  Ref. Range 11/25/2014 13:19 11/25/2014 13:19 11/25/2014 13:46 11/25/2014 15:00  Sodium Latest Ref Range: 135-145 mmol/L   135   Potassium Latest Ref Range:  3.5-5.1 mmol/L   4.0   Chloride Latest Ref Range: 101-111 mmol/L   98 (L)   CO2 Latest Ref Range: 22-32 mmol/L   29   BUN Latest Ref Range: 6-20 mg/dL   19   Creatinine Latest Ref Range: 0.61-1.24 mg/dL   0.97   Calcium Latest Ref Range: 8.9-10.3 mg/dL   9.2   EGFR (Non-African Amer.) Latest Ref Range: >60 mL/min   >60   EGFR (African American) Latest Ref Range: >60 mL/min   >60   Glucose Latest Ref Range: 65-99 mg/dL   98   Anion gap Latest Ref Range: 5-15    8   Alkaline Phosphatase Latest Ref Range: 38-126 U/L   41   Albumin Latest Ref Range: 3.5-5.0 g/dL   4.3   AST Latest Ref Range: 15-41 U/L   20   ALT Latest Ref Range: 17-63 U/L   20   Total Protein Latest Ref Range: 6.5-8.1 g/dL   7.5   Total Bilirubin Latest Ref Range: 0.3-1.2 mg/dL   0.4   Vitamin B-12 Latest Ref Range: 180-914 pg/mL    266  TSH Latest Ref Range: 0.350-4.500 uIU/mL   2.581   EKG 12-LEAD Unknown Rpt Rpt      See  Suicide Risk Assessment completed by Attending Physician prior to discharge.  Discharge destination:  Home  Is patient on multiple antipsychotic therapies at discharge:  No   Has Patient had three or more failed trials of antipsychotic monotherapy by history:  No    Recommended Plan for Multiple Antipsychotic Therapies: NA      Discharge Instructions    Diet - low sodium heart healthy    Complete by:  As directed             Medication List    STOP taking these medications        ibuprofen 200 MG tablet  Commonly known as:  ADVIL,MOTRIN      TAKE these medications      Indication   citalopram 20 MG tablet  Commonly known as:  CELEXA  Take 1 tablet (20 mg total) by mouth daily.  Notes to Patient:  Depression      disulfiram 250 MG tablet  Commonly known as:  ANTABUSE  Take 1 tablet (250 mg total) by mouth daily.  Notes to Patient:  alcoholism      hydrochlorothiazide 25 MG tablet  Commonly known as:  HYDRODIURIL  Take 1 tablet (25 mg total) by mouth daily.  Notes to  Patient:  Blood pressure      nicotine 10 MG inhaler  Commonly known as:  NICOTROL  Inhale 1 cartridge (1 continuous puffing total) into the lungs as needed for smoking cessation.  Notes to Patient:  Nicotine cravings      simvastatin 20 MG tablet  Commonly known as:  ZOCOR  Take 1 tablet (20 mg total) by mouth daily at 6 PM.  Notes to Patient:  Cholesterol      traZODone 100 MG tablet  Commonly known as:  DESYREL  Take 1 tablet (100 mg total) by mouth at bedtime as needed for sleep.  Notes to Patient:  Insomnia        Follow-up Information    Follow up with [facility]. Go in 1 week.   Why:  Hospital Discharge Follow up Patient is to go to [place] office and apply for medicaid and then go to walk in clinic for assessment and psychiatric follow up appointment.   Contact information:   [address] [place] [address] [phone] Fax [phone]      Follow-up recommendations:  Other:  Follow-up with [facility] intensive outpatient substance abuse   Total Discharge Time: 30 minutes  Signed: Hildred Priest 11/30/2014, 8:11 AM

## 2014-11-30 NOTE — Progress Notes (Signed)
Recreation Therapy Notes  INPATIENT RECREATION TR PLAN  Patient Details Name: Dylan Hughes MRN: [id number] DOB: [birth year] Today's Date: 11/30/2014  Rec Therapy Plan Is patient appropriate for Therapeutic Recreation?: Yes Treatment times per week: At least 3 times a week TR Treatment/Interventions: 1:1 session, Group participation (Comment) (Appropriate participation in daily recreation therapy tx)  Discharge Criteria Pt will be discharged from therapy if:: Discharged Treatment plan/goals/alternatives discussed and agreed upon by:: Patient/family  Discharge Summary Short term goals set: See Care Plan Short term goals met: Complete Progress toward goals comments: One-to-one attended Which groups?: Communication, Social skills, Leisure education One-to-one attended: Self-esteem, stress management, coping skills Reason goals not met: N/A Therapeutic equipment acquired: None Reason patient discharged from therapy: Discharge from hospital Pt/family agrees with progress & goals achieved: Yes Date patient discharged from therapy: 11/30/14   Leonette Monarch, LRT/CTRS 11/30/2014, 11:22 AM

## 2018-05-25 ENCOUNTER — Emergency Department (HOSPITAL_COMMUNITY): Payer: Self-pay

## 2018-05-25 ENCOUNTER — Emergency Department (HOSPITAL_COMMUNITY)
Admission: EM | Admit: 2018-05-25 | Discharge: 2018-05-25 | Disposition: A | Discharge status: Home / Self Care | Payer: Self-pay | Attending: Emergency Medicine | Admitting: Emergency Medicine

## 2018-05-25 DIAGNOSIS — M5431 Sciatica, right side: Secondary | ICD-10-CM | POA: Insufficient documentation

## 2018-05-25 DIAGNOSIS — M5432 Sciatica, left side: Secondary | ICD-10-CM | POA: Insufficient documentation

## 2018-05-25 MED ORDER — PREDNISONE 20 MG PO TABS: ORAL_TABLET | ORAL | 0 refills | Status: DC

## 2018-05-25 MED ORDER — IBUPROFEN 600 MG PO TABS: 600.0000 mg | ORAL_TABLET | Freq: Four times a day (QID) | ORAL | 0 refills | Status: DC | PRN

## 2018-05-25 MED ORDER — CYCLOBENZAPRINE HCL 10 MG PO TABS: 10.0000 mg | ORAL_TABLET | Freq: Two times a day (BID) | ORAL | 0 refills | Status: DC | PRN

## 2018-05-25 MED ORDER — IBUPROFEN 800 MG PO TABS
800.0000 mg | ORAL_TABLET | Freq: Once | ORAL | Status: AC
  Administered 2018-05-25: 800 mg via ORAL
  Filled 2018-05-25: qty 1

## 2018-05-25 MED ORDER — PREDNISONE 20 MG PO TABS
60.0000 mg | ORAL_TABLET | Freq: Once | ORAL | Status: AC
  Administered 2018-05-25: 60 mg via ORAL
  Filled 2018-05-25: qty 3

## 2018-05-25 MED ORDER — CYCLOBENZAPRINE HCL 10 MG PO TABS
5.0000 mg | ORAL_TABLET | Freq: Once | ORAL | Status: AC
  Administered 2018-05-25: 5 mg via ORAL
  Filled 2018-05-25: qty 1

## 2018-05-25 NOTE — ED Triage Notes (Signed)
Pt reports that he has had lower back pain x 4 days. Pt states that he is unable to sit, cough or sneeze without causing pain. Pt radiates to legs and cramping sensation. 10/10 pain. Pt reports that he has been in taking,  ETHOL 5 beers today, and BC powders for pain.

## 2018-05-25 NOTE — ED Provider Notes (Signed)
[facility]-EMERGENCY DEPT Provider Note   CSN: [id number] Arrival date & time: 05/25/18  1455     History   Chief Complaint No chief complaint on file.   HPI Dylan Hughes is a 42 y.o. male.  The history is provided by the patient. No language interpreter was used.  Back Pain   Pertinent negatives include no fever and no numbness.     42 year old male with history of hypertension, depression, alcohol abuse, social anxiety presenting for evaluation of back pain.  Patient report gradual onset of progressive worsening back pain ongoing for the past 4 days.  He described pain as a dull sharp shooting pain starting his lower back radiates down to both of his legs, worsening with bending over with walking.  Pain is severe, he found some relief when he stands up straight, increasing pain when he tries to sit down or with walking.  He felt that both legs are weaker likely due to pain.  He did not report of any fever, chills, abdominal pain, bowel bladder incontinence or saddle anesthesia.  No history of IV drug use active cancer.  He did report pain intensify whenever he laughs, coughs, or exert any pressure.  He did report one episode of both bowel and bladder incontinence yesterday when the pain was intense.  He admits to drinking more alcohol than usual to cope with the pain without adequate relief.  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  No specific injury.  Patient is a smoker.  Past Medical History:  Diagnosis Date  . History of high cholesterol   . Hypertension     Patient Active Problem List   Diagnosis Date Noted  . Major depressive disorder, recurrent, severe without psychotic features (HCC)   . Essential hypertension   . Alcohol use disorder, severe, dependence (HCC) 11/25/2014  . Tobacco use disorder 11/25/2014  . Social anxiety disorder 11/25/2014  . Dyslipidemia 11/25/2014    No past surgical  history on file.      Home Medications    Prior to Admission medications   Medication Sig Start Date End Date Taking? Authorizing Provider  citalopram (CELEXA) 20 MG tablet Take 1 tablet (20 mg total) by mouth daily. 11/27/14   Jimmy FootmanHernandez-Gonzalez, Andrea, MD  disulfiram (ANTABUSE) 250 MG tablet Take 1 tablet (250 mg total) by mouth daily. 11/30/14   Jimmy FootmanHernandez-Gonzalez, Andrea, MD  hydrochlorothiazide (HYDRODIURIL) 25 MG tablet Take 1 tablet (25 mg total) by mouth daily. 11/27/14   Jimmy FootmanHernandez-Gonzalez, Andrea, MD  nicotine (NICOTROL) 10 MG inhaler Inhale 1 cartridge (1 continuous puffing total) into the lungs as needed for smoking cessation. 11/27/14   Jimmy FootmanHernandez-Gonzalez, Andrea, MD  simvastatin (ZOCOR) 20 MG tablet Take 1 tablet (20 mg total) by mouth daily at 6 PM. 11/27/14   Jimmy FootmanHernandez-Gonzalez, Andrea, MD  traZODone (DESYREL) 100 MG tablet Take 1 tablet (100 mg total) by mouth at bedtime as needed for sleep. 11/27/14   Jimmy FootmanHernandez-Gonzalez, Andrea, MD    Family History No family history on file.  Social History Social History   Tobacco Use  . Smoking status: Current Every Day Smoker    Packs/day: 1.00    Years: 20.00    Pack years: 20.00    Types: Cigarettes  Substance Use Topics  . Alcohol use: Yes    Alcohol/week: 28.0 standard drinks    Types: 8 Glasses of wine, 12 Cans of beer, 8 Shots of liquor per week  Comment: Every other day.  . Drug use: No     Allergies   Patient has no known allergies.   Review of Systems Review of Systems  Constitutional: Negative for fever.  Musculoskeletal: Positive for back pain.  Skin: Negative for wound.  Neurological: Negative for numbness.     Physical Exam Updated Vital Signs BP (!) 124/94 (BP Location: Left Arm)   Pulse (!) 123   Temp 98.5 F (36.9 C) (Oral)   Resp 18   Ht 5\' 6"  (1.676 m)   Wt 55.8 kg   SpO2 97%   BMI 19.85 kg/m   Physical Exam  Constitutional: He appears well-developed and well-nourished. No distress.   Patient is standing up, appears very uncomfortable, wincing.  HENT:  Head: Atraumatic.  Eyes: Conjunctivae are normal.  Neck: Neck supple.  Cardiovascular:  Tachycardia  Abdominal: Soft. He exhibits no distension. There is no tenderness.  Musculoskeletal: He exhibits tenderness (Tenderness along lumbar spine and paralumbar spinal muscle.  Tenderness to both lumbosacral region.  Positive straight leg raise bilaterally.  Able to ambulate.).  Neurological: He is alert.  Patellar deep tendon reflex intact bilaterally, no foot drops.  Positive straight leg raise.  Skin: No rash noted.  Psychiatric: He has a normal mood and affect.  Nursing note and vitals reviewed.    ED Treatments / Results  Labs (all labs ordered are listed, but only abnormal results are displayed) Labs Reviewed - No data to display  EKG None  Radiology No results found.  Procedures Procedures (including critical care time)  Medications Ordered in ED Medications  predniSONE (DELTASONE) tablet 60 mg (60 mg Oral Given 05/25/18 1543)  ibuprofen (ADVIL,MOTRIN) tablet 800 mg (800 mg Oral Given 05/25/18 1543)  cyclobenzaprine (FLEXERIL) tablet 5 mg (5 mg Oral Given 05/25/18 1543)     Initial Impression / Assessment and Plan / ED Course  I have reviewed the triage vital signs and the nursing notes.  Pertinent labs & imaging results that were available during my care of the patient were reviewed by me and considered in my medical decision making (see chart for details).     BP (!) 124/94 (BP Location: Left Arm)   Pulse (!) 127   Temp 98.5 F (36.9 C) (Oral)   Resp 18   Ht 5\' 6"  (1.676 m)   Wt 55.8 kg   SpO2 97%   BMI 19.85 kg/m    Final Clinical Impressions(s) / ED Diagnoses   Final diagnoses:  Bilateral sciatica    ED Discharge Orders         Ordered    cyclobenzaprine (FLEXERIL) 10 MG tablet  2 times daily PRN     05/25/18 1605    predniSONE (DELTASONE) 20 MG tablet     05/25/18 1605     ibuprofen (ADVIL,MOTRIN) 600 MG tablet  Every 6 hours PRN     05/25/18 1605         3:40 PM Patient here with radicular back pain likely sciatica.  No red flags.  He is able to ambulate.  He appears very uncomfortable.  He is tachycardic likely secondary to pain.  He also drinks a significant amount of alcohol however he does not have any significant abdominal discomfort to suggest GI pathology causing his symptoms.  No specific injury.  At this time, will provide anti-inflammatory medication, steroid, and muscle relaxant.  Patient made aware of the risk of GI upset and potential GI complication with this medication if taking with alcohol.  Encourage patient to avoid alcohol in which patient agrees.  4:04 PM Pt remains tachycardic, he is still in pain but prefers to be discharge in order to go out for a smoke.  I encourage RICE therapy and gave strict return precaution.  Pt voice understanding and agrees with plan.    Fayrene Helper, PA-C 05/25/18 1606    Raeford Razor, MD 05/25/18 [phone]

## 2018-05-25 NOTE — ED Notes (Signed)
Bed: WTR6 Expected date:  Expected time:  Means of arrival:  Comments: 

## 2019-09-03 ENCOUNTER — Other Ambulatory Visit: Payer: Self-pay

## 2019-09-03 DIAGNOSIS — Y999 Unspecified external cause status: Secondary | ICD-10-CM | POA: Insufficient documentation

## 2019-09-03 DIAGNOSIS — I10 Essential (primary) hypertension: Secondary | ICD-10-CM | POA: Insufficient documentation

## 2019-09-03 DIAGNOSIS — X781XXA Intentional self-harm by knife, initial encounter: Secondary | ICD-10-CM | POA: Insufficient documentation

## 2019-09-03 DIAGNOSIS — W260XXA Contact with knife, initial encounter: Secondary | ICD-10-CM | POA: Insufficient documentation

## 2019-09-03 DIAGNOSIS — F10920 Alcohol use, unspecified with intoxication, uncomplicated: Secondary | ICD-10-CM | POA: Insufficient documentation

## 2019-09-03 DIAGNOSIS — Z79899 Other long term (current) drug therapy: Secondary | ICD-10-CM | POA: Insufficient documentation

## 2019-09-03 DIAGNOSIS — F17210 Nicotine dependence, cigarettes, uncomplicated: Secondary | ICD-10-CM | POA: Insufficient documentation

## 2019-09-03 DIAGNOSIS — Y9389 Activity, other specified: Secondary | ICD-10-CM | POA: Insufficient documentation

## 2019-09-03 DIAGNOSIS — Y92009 Unspecified place in unspecified non-institutional (private) residence as the place of occurrence of the external cause: Secondary | ICD-10-CM | POA: Insufficient documentation

## 2019-09-03 DIAGNOSIS — S61512A Laceration without foreign body of left wrist, initial encounter: Secondary | ICD-10-CM | POA: Insufficient documentation

## 2019-09-03 DIAGNOSIS — F329 Major depressive disorder, single episode, unspecified: Secondary | ICD-10-CM | POA: Insufficient documentation

## 2019-09-04 ENCOUNTER — Emergency Department (HOSPITAL_COMMUNITY)
Admission: EM | Admit: 2019-09-04 | Discharge: 2019-09-04 | Disposition: A | Discharge status: Home / Self Care | Payer: Self-pay | Attending: Emergency Medicine | Admitting: Emergency Medicine

## 2019-09-04 ENCOUNTER — Other Ambulatory Visit: Payer: Self-pay

## 2019-09-04 ENCOUNTER — Encounter (HOSPITAL_COMMUNITY): Payer: Self-pay | Admitting: *Deleted

## 2019-09-04 DIAGNOSIS — Z7289 Other problems related to lifestyle: Secondary | ICD-10-CM

## 2019-09-04 DIAGNOSIS — F10920 Alcohol use, unspecified with intoxication, uncomplicated: Secondary | ICD-10-CM

## 2019-09-04 DIAGNOSIS — S61512A Laceration without foreign body of left wrist, initial encounter: Secondary | ICD-10-CM

## 2019-09-04 DIAGNOSIS — IMO0002 Reserved for concepts with insufficient information to code with codable children: Secondary | ICD-10-CM

## 2019-09-04 LAB — COMPREHENSIVE METABOLIC PANEL
ALT: 30 U/L (ref 0–44)
AST: 31 U/L (ref 15–41)
Albumin: 4.8 g/dL (ref 3.5–5.0)
Alkaline Phosphatase: 56 U/L (ref 38–126)
Anion gap: 12 (ref 5–15)
BUN: 11 mg/dL (ref 6–20)
CO2: 26 mmol/L (ref 22–32)
Calcium: 9.2 mg/dL (ref 8.9–10.3)
Chloride: 101 mmol/L (ref 98–111)
Creatinine, Ser: 0.83 mg/dL (ref 0.61–1.24)
GFR calc Af Amer: >60 mL/min (ref >60)
GFR calc non Af Amer: >60 mL/min (ref >60)
Glucose, Bld: 96 mg/dL (ref 70–99)
Potassium: 4.6 mmol/L (ref 3.5–5.1)
Sodium: 139 mmol/L (ref 135–145)
Total Bilirubin: 0.5 mg/dL (ref 0.3–1.2)
Total Protein: 8.5 g/dL — ABNORMAL HIGH (ref 6.5–8.1)

## 2019-09-04 LAB — CBC WITH DIFFERENTIAL/PLATELET
Abs Immature Granulocytes: 0.04 10*3/uL (ref 0.00–0.07)
Basophils Absolute: 0.1 10*3/uL (ref 0.0–0.1)
Basophils Relative: 1 %
Eosinophils Absolute: 0.7 10*3/uL — ABNORMAL HIGH (ref 0.0–0.5)
Eosinophils Relative: 10 %
HCT: 48.8 % (ref 39.0–52.0)
Hemoglobin: 16.6 g/dL (ref 13.0–17.0)
Immature Granulocytes: 1 %
Lymphocytes Relative: 41 %
Lymphs Abs: 3.1 10*3/uL (ref 0.7–4.0)
MCH: 33.5 pg (ref 26.0–34.0)
MCHC: 34 g/dL (ref 30.0–36.0)
MCV: 98.4 fL (ref 80.0–100.0)
Monocytes Absolute: 0.6 10*3/uL (ref 0.1–1.0)
Monocytes Relative: 8 %
Neutro Abs: 2.9 10*3/uL (ref 1.7–7.7)
Neutrophils Relative %: 39 %
Platelets: 287 10*3/uL (ref 150–400)
RBC: 4.96 MIL/uL (ref 4.22–5.81)
RDW: 12.6 % (ref 11.5–15.5)
WBC: 7.4 10*3/uL (ref 4.0–10.5)
nRBC: 0 % (ref 0.0–0.2)

## 2019-09-04 LAB — ETHANOL: Alcohol, Ethyl (B): 238 mg/dL — ABNORMAL HIGH (ref <10)

## 2019-09-04 LAB — SALICYLATE LEVEL: Salicylate Lvl: <7 mg/dL — ABNORMAL LOW (ref 7.0–30.0)

## 2019-09-04 LAB — ACETAMINOPHEN LEVEL: Acetaminophen (Tylenol), Serum: <10 ug/mL — ABNORMAL LOW (ref 10–30)

## 2019-09-04 MED ORDER — LIDOCAINE-EPINEPHRINE-TETRACAINE (LET) TOPICAL GEL: 3.0000 mL | Freq: Once | TOPICAL | Status: DC

## 2019-09-04 NOTE — ED Provider Notes (Signed)
[facility] EMERGENCY DEPARTMENT Provider Note   CSN: [id number] Arrival date & time: 09/03/19  2350   Time seen 2:40 AM  History Chief Complaint  Patient presents with  . V70.1   Level 5 caveat for psychiatric illness  Dylan Hughes is a 44 y.o. male.  HPI   Patient presents the emergency department with police escort.  He states he was angry at himself, when I asked him why he states he is frustrated, when I asked him why he states he is losing his house.  Patient is noted to have multiple parallel lacerations on the volar aspect of both wrists.  When asked how that happened he states he was "playing a game".  Patient states "I am invincible".  He keeps asking me when he can go home.  I told him he would have to stay to finish his psychiatric evaluation at which point he states "I'll have a good time playing with them".  Patient is not very cooperative.  He states he has been drinking.  Patient is right-handed.  PCP Default, Provider, MD   Past Medical History:  Diagnosis Date  . History of high cholesterol   . Hypertension     Patient Active Problem List   Diagnosis Date Noted  . Major depressive disorder, recurrent, severe without psychotic features ([place])   . Essential hypertension   . Alcohol use disorder, severe, dependence ([place]) 11/25/2014  . Tobacco use disorder 11/25/2014  . Social anxiety disorder 11/25/2014  . Dyslipidemia 11/25/2014    History reviewed. No pertinent surgical history.     History reviewed. No pertinent family history.  Social History   Tobacco Use  . Smoking status: Current Every Day Smoker    Packs/day: 0.50    Years: 20.00    Pack years: 10.00    Types: Cigarettes  . Smokeless tobacco: Never Used  Substance Use Topics  . Alcohol use: Yes    Alcohol/week: 28.0 standard drinks    Types: 8 Glasses of wine, 12 Cans of beer, 8 Shots of liquor per week    Comment: Every other day.  . Drug use: No    Home Medications Prior to  Admission medications   Medication Sig Start Date End Date Taking? Authorizing Provider  citalopram (CELEXA) 20 MG tablet Take 1 tablet (20 mg total) by mouth daily. 11/27/14   Hildred Priest, MD  cyclobenzaprine (FLEXERIL) 10 MG tablet Take 1 tablet (10 mg total) by mouth 2 (two) times daily as needed for muscle spasms. 05/25/18   Domenic Moras, PA-C  disulfiram (ANTABUSE) 250 MG tablet Take 1 tablet (250 mg total) by mouth daily. 11/30/14   Hildred Priest, MD  hydrochlorothiazide (HYDRODIURIL) 25 MG tablet Take 1 tablet (25 mg total) by mouth daily. 11/27/14   Hildred Priest, MD  ibuprofen (ADVIL,MOTRIN) 600 MG tablet Take 1 tablet (600 mg total) by mouth every 6 (six) hours as needed. 05/25/18   Domenic Moras, PA-C  nicotine (NICOTROL) 10 MG inhaler Inhale 1 cartridge (1 continuous puffing total) into the lungs as needed for smoking cessation. 11/27/14   Hildred Priest, MD  predniSONE (DELTASONE) 20 MG tablet 3 tabs po day one, then 2 tabs daily x 4 days 05/25/18   Domenic Moras, PA-C  simvastatin (ZOCOR) 20 MG tablet Take 1 tablet (20 mg total) by mouth daily at 6 PM. 11/27/14   Hildred Priest, MD  traZODone (DESYREL) 100 MG tablet Take 1 tablet (100 mg total) by mouth at bedtime as needed for sleep.  11/27/14   Jimmy Footman, MD    Allergies    Patient has no known allergies.  Review of Systems   Review of Systems  All other systems reviewed and are negative.   Physical Exam Updated Vital Signs Ht 5\' 5"  (1.651 m)   Wt 49.9 kg   BMI 18.30 kg/m   Physical Exam Vitals and nursing note reviewed.  Constitutional:      General: He is not in acute distress.    Appearance: Normal appearance. He is well-developed. He is not ill-appearing or toxic-appearing.  HENT:     Head: Normocephalic and atraumatic.     Right Ear: External ear normal.     Left Ear: External ear normal.     Nose: Nose normal. No mucosal edema or  rhinorrhea.     Mouth/Throat:     Dentition: No dental abscesses.     Pharynx: No uvula swelling.  Eyes:     Conjunctiva/sclera: Conjunctivae normal.     Pupils: Pupils are equal, round, and reactive to light.  Cardiovascular:     Rate and Rhythm: Normal rate and regular rhythm.     Heart sounds: Normal heart sounds. No murmur. No friction rub. No gallop.   Pulmonary:     Effort: Pulmonary effort is normal. No respiratory distress.     Breath sounds: Normal breath sounds. No wheezing, rhonchi or rales.  Chest:     Chest wall: No tenderness or crepitus.  Musculoskeletal:        General: No tenderness. Normal range of motion.     Cervical back: Full passive range of motion without pain, normal range of motion and neck supple.     Comments: Moves all extremities well.   Skin:    General: Skin is warm and dry.     Coloration: Skin is not pale.     Findings: No erythema or rash.  Neurological:     Mental Status: He is alert and oriented to person, place, and time.     Cranial Nerves: No cranial nerve deficit.  Psychiatric:        Mood and Affect: Affect is labile.        Speech: Speech normal.        Behavior: Behavior is hyperactive.     Right wrist    Left wrist     ED Results / Procedures / Treatments   Labs (all labs ordered are listed, but only abnormal results are displayed) Results for orders placed or performed during the hospital encounter of 09/04/19  Comprehensive metabolic panel  Result Value Ref Range   Sodium 139 135 - 145 mmol/L   Potassium 4.6 3.5 - 5.1 mmol/L   Chloride 101 98 - 111 mmol/L   CO2 26 22 - 32 mmol/L   Glucose, Bld 96 70 - 99 mg/dL   BUN 11 6 - 20 mg/dL   Creatinine, Ser 09/06/19 0.61 - 1.24 mg/dL   Calcium 9.2 8.9 - 1.15 mg/dL   Total Protein 8.5 (H) 6.5 - 8.1 g/dL   Albumin 4.8 3.5 - 5.0 g/dL   AST 31 15 - 41 U/L   ALT 30 0 - 44 U/L   Alkaline Phosphatase 56 38 - 126 U/L   Total Bilirubin 0.5 0.3 - 1.2 mg/dL   GFR calc non Af Amer  >60 >60 mL/min   GFR calc Af Amer >60 >60 mL/min   Anion gap 12 5 - 15  Acetaminophen level  Result Value Ref Range  Acetaminophen (Tylenol), Serum <10 (L) 10 - 30 ug/mL  Ethanol  Result Value Ref Range   Alcohol, Ethyl (B) 238 (H) <10 mg/dL  Salicylate level  Result Value Ref Range   Salicylate Lvl <7.0 (L) 7.0 - 30.0 mg/dL  CBC with Differential  Result Value Ref Range   WBC 7.4 4.0 - 10.5 K/uL   RBC 4.96 4.22 - 5.81 MIL/uL   Hemoglobin 16.6 13.0 - 17.0 g/dL   HCT 92.4 26.8 - 34.1 %   MCV 98.4 80.0 - 100.0 fL   MCH 33.5 26.0 - 34.0 pg   MCHC 34.0 30.0 - 36.0 g/dL   RDW 96.2 22.9 - 79.8 %   Platelets 287 150 - 400 K/uL   nRBC 0.0 0.0 - 0.2 %   Neutrophils Relative % 39 %   Neutro Abs 2.9 1.7 - 7.7 K/uL   Lymphocytes Relative 41 %   Lymphs Abs 3.1 0.7 - 4.0 K/uL   Monocytes Relative 8 %   Monocytes Absolute 0.6 0.1 - 1.0 K/uL   Eosinophils Relative 10 %   Eosinophils Absolute 0.7 (H) 0.0 - 0.5 K/uL   Basophils Relative 1 %   Basophils Absolute 0.1 0.0 - 0.1 K/uL   Immature Granulocytes 1 %   Abs Immature Granulocytes 0.04 0.00 - 0.07 K/uL   Laboratory interpretation all normal except alcohol intoxication    EKG None  Radiology No results found.  Procedures Procedures (including critical care time)  Medications Ordered in ED Medications  lidocaine-EPINEPHrine-tetracaine (LET) topical gel (has no administration in time range)    ED Course  I have reviewed the triage vital signs and the nursing notes.  Pertinent labs & imaging results that were available during my care of the patient were reviewed by me and considered in my medical decision making (see chart for details).    MDM Rules/Calculators/A&P                      Patient is laughing inappropriately.  He refuses to have the 1 laceration that appears to be deep on his left wrist repaired or sutured.  He states "I will just glue it when I get home".  He keeps asking me when he can go home, IVC papers  were filled out because I felt like he was a flight risk.  Patient refused sutures for the 1 deep laceration on his volar aspect of his left wrist.  He then refused let to be placed on the wound.  He wants to put superglue on it himself.  He finally agreed to let the male nurse put Dermabond on the laceration which was 4 cm in length.  Patient was cautioned to not use soap on the Dermabond or it will dissolve too quickly.  6:10 AM patient is still waiting to have TTS evaluation.  Final Clinical Impression(s) / ED Diagnoses Final diagnoses:  Alcoholic intoxication without complication (HCC)  Self-inflicted injury  Laceration of left wrist, initial encounter    Rx / DC Orders  Disposition pending  Devoria Albe, MD, Concha Pyo, MD 09/04/19 [phone]

## 2019-09-04 NOTE — ED Notes (Signed)
IVC paperwork faxed to Magistrate's office and to [facility] at this time.

## 2019-09-04 NOTE — Discharge Instructions (Addendum)
Follow-up as instructed by behavioral health 

## 2019-09-04 NOTE — ED Notes (Signed)
Pt refuses to get urine sample

## 2019-09-04 NOTE — ED Triage Notes (Signed)
Pt brought in by [employer] for c/o self inflicted wrist lacerations; pt states he was playing a "game"; pt denies any SI/HI

## 2019-09-04 NOTE — BH Assessment (Addendum)
Assessment Note  Dylan Hughes is an 44 y.o. male. Patient presents to [facility] by police. States that he doesn't know who called the police. He reports, "Vodka brought me here". Patient laughs stating, "I'm so embarrassed". He explain feeling increasingly stressed about his father being ill, his lack of finances, unemployment, lack of transportation, and his children recently moving out of state. He decided along with his roommate to drink Vodka and beer. He admits to drinking beer daily. He typically drinks 1-2 (12 ounce) cans of beer a day. He only added the vodka because of his stress. He has cuts on his arms. Patient describes his cuts as scratches. States that he has a history of superficial cutting. He cut himself with knife. He denies SI. Denies a history of suicidal attempts and gestures. No HI. No history of harm to others. No legal issues. No AVH's. He has no history of inpatient treatment. No history of outpatient treatment. He is calm and cooperative. He is oriented to time, person, place, and situation. Speech is normal. Affect is appropriate. Mood is appropriate. He is oriented to person, place, time, and situation.   Counselor attempted to call patient's roommates mother for collateral. Her name is Trula Slade [phone]. Called 3 times. No answer. Unable to obtain collateral.   Diagnosis: Depressive Disorder   Past Medical History:  Past Medical History:  Diagnosis Date  . History of high cholesterol   . Hypertension     History reviewed. No pertinent surgical history.  Family History: History reviewed. No pertinent family history.  Social History:  reports that he has been smoking cigarettes. He has a 10.00 pack-year smoking history. He has never used smokeless tobacco. He reports current alcohol use of about 28.0 standard drinks of alcohol per week. He reports that he does not use drugs.  Additional Social History:  Alcohol / Drug Use Pain Medications: SEE MAR Prescriptions:  SEE MAR Over the Counter: SEE MAR History of alcohol / drug use?: Yes Longest period of sobriety (when/how long): 4.5 yrs Negative Consequences of Use: Financial, Legal, Personal relationships, Work / School Substance #1 Name of Substance 1: Alcohol 1 - Age of First Use: 18 months 1 - Amount (size/oz): 1-2 beers (12 ounce per beer) 1 - Frequency: daily 1 - Duration: on-going 1 - Last Use / Amount: binge; "I drank alot of Vodka"; "I ordered a pint and had several beers"  CIWA:   COWS:    Allergies: No Known Allergies  Home Medications: (Not in a hospital admission)   OB/GYN Status:  No LMP for male patient.  General Assessment Data Location of Assessment: [facility] TTS Assessment: In system Is this a Tele or Face-to-Face Assessment?: Tele Assessment Is this an Initial Assessment or a Re-assessment for this encounter?: Initial Assessment Patient Accompanied by:: (brought by police) Language Other than English: No Living Arrangements: Other (Comment)("I live with a roommate") What gender do you identify as?: Male Marital status: Separated Maiden name: (n/a) Pregnancy Status: (n/a) Living Arrangements: Other (Comment), Non-relatives/Friends(roommate ) Can pt return to current living arrangement?: Yes Admission Status: Voluntary Is patient capable of signing voluntary admission?: Yes Referral Source: Self/Family/Friend Insurance type: (No insurance)     Crisis Care Plan Living Arrangements: Other (Comment), Non-relatives/Friends(roommate ) Legal Guardian: (no legal guardian ) Name of Psychiatrist: (none reported) Name of Therapist: (none reported)     Risk to self with the past 6 months Suicidal Ideation: No Has patient been a risk to self within the past 6  months prior to admission? : No Suicidal Intent: No Has patient had any suicidal intent within the past 6 months prior to admission? : No Is patient at risk for suicide?: No Suicidal Plan?: No Has patient had any  suicidal plan within the past 6 months prior to admission? : No Access to Means: No What has been your use of drugs/alcohol within the last 12 months?: (heavy alcohol use) Previous Attempts/Gestures: No How many times?: (0) Other Self Harm Risks: (ptaient reports cutting himself ) Triggers for Past Attempts: (no past attempts or gestures ) Intentional Self Injurious Behavior: Cutting(history of superficial cutting) Comment - Self Injurious Behavior: (hx of superficial cutting) Family Suicide History: No Recent stressful life event(s): Other (Comment), Conflict (Comment), Loss (Comment), Job Loss, Financial Problems, Legal Issues(employment, children moved away, finances, fathers health) Persecutory voices/beliefs?: No Depression: Yes Depression Symptoms: Feeling angry/irritable, Isolating, Loss of interest in usual pleasures, Feeling worthless/self pity, Fatigue, Tearfulness Substance abuse history and/or treatment for substance abuse?: No Suicide prevention information given to non-admitted patients: Not applicable  Risk to Others within the past 6 months Homicidal Ideation: No Does patient have any lifetime risk of violence toward others beyond the six months prior to admission? : No Thoughts of Harm to Others: No Current Homicidal Intent: No Current Homicidal Plan: No Access to Homicidal Means: No Identified Victim: (n/a) History of harm to others?: No Assessment of Violence: None Noted Violent Behavior Description: (patient currently calm and cooperative ) Does patient have access to weapons?: Yes (Comment)(knives) Criminal Charges Pending?: No Does patient have a court date: No Is patient on probation?: No  Psychosis Hallucinations: None noted(patient denies ) Delusions: None noted(denies )  Mental Status Report Appearance/Hygiene: In scrubs Eye Contact: Good Motor Activity: Freedom of movement Speech: Logical/coherent Level of Consciousness: Alert Mood:  Depressed Affect: Appropriate to circumstance Anxiety Level: None Thought Processes: Relevant, Coherent Judgement: Unimpaired Orientation: Person, Time, Situation, Place Obsessive Compulsive Thoughts/Behaviors: None  Cognitive Functioning Concentration: Normal Memory: Remote Intact, Recent Intact Is patient IDD: No Level of Function: (appropriate ) Is IQ score available?: (n/a) Insight: Fair Impulse Control: Fair Appetite: Good Have you had any weight changes? : No Change Sleep: Decreased Total Hours of Sleep: ("I wake up alot during the night") Vegetative Symptoms: None  ADLScreening [facility] Assessment Services) Patient's cognitive ability adequate to safely complete daily activities?: Yes Patient able to express need for assistance with ADLs?: Yes Independently performs ADLs?: Yes (appropriate for developmental age)  Prior Inpatient Therapy Prior Inpatient Therapy: No  Prior Outpatient Therapy Prior Outpatient Therapy: No Does patient have an ACCT team?: No Does patient have Intensive In-House Services?  : No Does patient have [facility] services? : No Does patient have P4CC services?: No  ADL Screening (condition at time of admission) Patient's cognitive ability adequate to safely complete daily activities?: Yes Is the patient deaf or have difficulty hearing?: No Does the patient have difficulty seeing, even when wearing glasses/contacts?: No Does the patient have difficulty concentrating, remembering, or making decisions?: No Patient able to express need for assistance with ADLs?: Yes Does the patient have difficulty dressing or bathing?: No Independently performs ADLs?: Yes (appropriate for developmental age) Does the patient have difficulty walking or climbing stairs?: No Weakness of Legs: None Weakness of Arms/Hands: None  Home Assistive Devices/Equipment Home Assistive Devices/Equipment: None  Therapy Consults (therapy consults require a physician order) PT  Evaluation Needed: No OT Evalulation Needed: No SLP Evaluation Needed: No Abuse/Neglect Assessment (Assessment to be complete while patient is  alone) Abuse/Neglect Assessment Can Be Completed: Yes Physical Abuse: Denies Verbal Abuse: Denies Sexual Abuse: Denies Exploitation of patient/patient's resources: Denies Values / Beliefs Cultural Requests During Hospitalization: None Spiritual Requests During Hospitalization: None Consults Spiritual Care Consult Needed: No Transition of Care Team Consult Needed: No Advance Directives (For Healthcare) Does Patient Have a Medical Advance Directive?: No Nutrition Screen- [facility] Patient's home diet: Regular Has the patient recently lost weight without trying?: No Has the patient been eating poorly because of a decreased appetite?: No Malnutrition Screening Tool Score: 0        Disposition: Per Berneice Heinrich, NP, patient is psych cleared. Patient ok to discharge home with outpatient referrals for mental health therapy and substance use treatment.  Disposition Initial Assessment Completed for this Encounter: Yes  On Site Evaluation by:   Reviewed with Physician:    Melynda Ripple 09/04/2019 9:18 AM

## 2022-04-29 ENCOUNTER — Inpatient Hospital Stay (HOSPITAL_COMMUNITY): Payer: Self-pay

## 2022-04-29 ENCOUNTER — Encounter (HOSPITAL_COMMUNITY)
Admission: EM | Disposition: A | Discharge status: Home / Self Care | Payer: Self-pay | Source: Home / Self Care | Attending: Cardiology

## 2022-04-29 ENCOUNTER — Inpatient Hospital Stay (HOSPITAL_COMMUNITY)
Admission: EM | Admit: 2022-04-29 | Discharge: 2022-05-01 | DRG: 322 | Disposition: A | Discharge status: Home / Self Care | Payer: Self-pay | Attending: Cardiology | Admitting: Cardiology

## 2022-04-29 DIAGNOSIS — Z1152 Encounter for screening for COVID-19: Secondary | ICD-10-CM

## 2022-04-29 DIAGNOSIS — Z79899 Other long term (current) drug therapy: Secondary | ICD-10-CM

## 2022-04-29 DIAGNOSIS — F17210 Nicotine dependence, cigarettes, uncomplicated: Secondary | ICD-10-CM | POA: Diagnosis present

## 2022-04-29 DIAGNOSIS — I213 ST elevation (STEMI) myocardial infarction of unspecified site: Principal | ICD-10-CM

## 2022-04-29 DIAGNOSIS — I10 Essential (primary) hypertension: Secondary | ICD-10-CM | POA: Diagnosis present

## 2022-04-29 DIAGNOSIS — F1010 Alcohol abuse, uncomplicated: Secondary | ICD-10-CM | POA: Diagnosis present

## 2022-04-29 DIAGNOSIS — Z8249 Family history of ischemic heart disease and other diseases of the circulatory system: Secondary | ICD-10-CM

## 2022-04-29 DIAGNOSIS — I2109 ST elevation (STEMI) myocardial infarction involving other coronary artery of anterior wall: Principal | ICD-10-CM | POA: Diagnosis present

## 2022-04-29 DIAGNOSIS — Z955 Presence of coronary angioplasty implant and graft: Secondary | ICD-10-CM

## 2022-04-29 DIAGNOSIS — E7800 Pure hypercholesterolemia, unspecified: Secondary | ICD-10-CM | POA: Diagnosis present

## 2022-04-29 HISTORY — PX: LEFT HEART CATH AND CORONARY ANGIOGRAPHY: CATH118249

## 2022-04-29 HISTORY — PX: CORONARY/GRAFT ACUTE MI REVASCULARIZATION: CATH118305

## 2022-04-29 LAB — CBC WITH DIFFERENTIAL/PLATELET
Abs Immature Granulocytes: 0.01 10*3/uL (ref 0.00–0.07)
Basophils Absolute: 0.1 10*3/uL (ref 0.0–0.1)
Basophils Relative: 1 %
Eosinophils Absolute: 0.2 10*3/uL (ref 0.0–0.5)
Eosinophils Relative: 2 %
HCT: 46 % (ref 39.0–52.0)
Hemoglobin: 15.8 g/dL (ref 13.0–17.0)
Immature Granulocytes: 0 %
Lymphocytes Relative: 30 %
Lymphs Abs: 2.2 10*3/uL (ref 0.7–4.0)
MCH: 31.9 pg (ref 26.0–34.0)
MCHC: 34.3 g/dL (ref 30.0–36.0)
MCV: 92.7 fL (ref 80.0–100.0)
Monocytes Absolute: 0.5 10*3/uL (ref 0.1–1.0)
Monocytes Relative: 7 %
Neutro Abs: 4.4 10*3/uL (ref 1.7–7.7)
Neutrophils Relative %: 60 %
Platelets: 318 10*3/uL (ref 150–400)
RBC: 4.96 MIL/uL (ref 4.22–5.81)
RDW: 12.9 % (ref 11.5–15.5)
WBC: 7.3 10*3/uL (ref 4.0–10.5)
nRBC: 0 % (ref 0.0–0.2)

## 2022-04-29 LAB — HEMOGLOBIN A1C
Hgb A1c MFr Bld: 5 % (ref 4.8–5.6)
Mean Plasma Glucose: 96.8 mg/dL

## 2022-04-29 LAB — COMPREHENSIVE METABOLIC PANEL
ALT: 16 U/L (ref 0–44)
AST: 24 U/L (ref 15–41)
Albumin: 3.8 g/dL (ref 3.5–5.0)
Alkaline Phosphatase: 40 U/L (ref 38–126)
Anion gap: 12 (ref 5–15)
BUN: 9 mg/dL (ref 6–20)
CO2: 24 mmol/L (ref 22–32)
Calcium: 9 mg/dL (ref 8.9–10.3)
Chloride: 107 mmol/L (ref 98–111)
Creatinine, Ser: 0.84 mg/dL (ref 0.61–1.24)
GFR, Estimated: >60 mL/min (ref >60)
Glucose, Bld: 106 mg/dL — ABNORMAL HIGH (ref 70–99)
Potassium: 4.7 mmol/L (ref 3.5–5.1)
Sodium: 143 mmol/L (ref 135–145)
Total Bilirubin: 0.6 mg/dL (ref 0.3–1.2)
Total Protein: 6.4 g/dL — ABNORMAL LOW (ref 6.5–8.1)

## 2022-04-29 LAB — LIPID PANEL
Cholesterol: 203 mg/dL — ABNORMAL HIGH (ref 0–200)
HDL: 47 mg/dL (ref >40)
LDL Cholesterol: 143 mg/dL — ABNORMAL HIGH (ref 0–99)
Total CHOL/HDL Ratio: 4.3 RATIO
Triglycerides: 63 mg/dL (ref <150)
VLDL: 13 mg/dL (ref 0–40)

## 2022-04-29 LAB — GLUCOSE, CAPILLARY: Glucose-Capillary: 106 mg/dL — ABNORMAL HIGH (ref 70–99)

## 2022-04-29 LAB — RESP PANEL BY RT-PCR (FLU A&B, COVID) ARPGX2
Influenza A by PCR: NEGATIVE
Influenza B by PCR: NEGATIVE
SARS Coronavirus 2 by RT PCR: NEGATIVE

## 2022-04-29 LAB — TROPONIN I (HIGH SENSITIVITY): Troponin I (High Sensitivity): 183 ng/L (ref <18)

## 2022-04-29 SURGERY — CORONARY/GRAFT ACUTE MI REVASCULARIZATION
Anesthesia: LOCAL

## 2022-04-29 MED ORDER — SODIUM CHLORIDE 0.9% FLUSH
3.0000 mL | Freq: Two times a day (BID) | INTRAVENOUS | Status: DC
  Administered 2022-04-30 – 2022-05-01 (×3): 3 mL via INTRAVENOUS

## 2022-04-29 MED ORDER — NITROGLYCERIN IN D5W 200-5 MCG/ML-% IV SOLN
INTRAVENOUS | Status: AC
  Filled 2022-04-29: qty 250

## 2022-04-29 MED ORDER — NITROGLYCERIN 0.4 MG SL SUBL: 0.4000 mg | SUBLINGUAL_TABLET | SUBLINGUAL | Status: DC | PRN

## 2022-04-29 MED ORDER — HYDROMORPHONE HCL 1 MG/ML IJ SOLN
INTRAMUSCULAR | Status: AC
  Filled 2022-04-29: qty 0.5

## 2022-04-29 MED ORDER — NITROGLYCERIN IN D5W 200-5 MCG/ML-% IV SOLN
0.0000 ug/min | INTRAVENOUS | Status: DC
  Administered 2022-04-29: 5 ug/min via INTRAVENOUS

## 2022-04-29 MED ORDER — HEPARIN (PORCINE) IN NACL 1000-0.9 UT/500ML-% IV SOLN
INTRAVENOUS | Status: DC | PRN
  Administered 2022-04-29 (×2): 500 mL

## 2022-04-29 MED ORDER — LOSARTAN POTASSIUM 25 MG PO TABS
25.0000 mg | ORAL_TABLET | Freq: Every evening | ORAL | Status: DC
  Administered 2022-04-29 – 2022-04-30 (×2): 25 mg via ORAL
  Filled 2022-04-29 (×2): qty 1

## 2022-04-29 MED ORDER — CARVEDILOL 6.25 MG PO TABS
6.2500 mg | ORAL_TABLET | Freq: Two times a day (BID) | ORAL | Status: DC
  Administered 2022-04-30 – 2022-05-01 (×3): 6.25 mg via ORAL
  Filled 2022-04-29 (×3): qty 1

## 2022-04-29 MED ORDER — HYDROMORPHONE HCL 1 MG/ML IJ SOLN
INTRAMUSCULAR | Status: DC | PRN
  Administered 2022-04-29: .5 mg via INTRAVENOUS

## 2022-04-29 MED ORDER — TICAGRELOR 90 MG PO TABS
ORAL_TABLET | ORAL | Status: AC
  Filled 2022-04-29: qty 2

## 2022-04-29 MED ORDER — METOPROLOL TARTRATE 5 MG/5ML IV SOLN
INTRAVENOUS | Status: DC | PRN
  Administered 2022-04-29: 5 mg via INTRAVENOUS

## 2022-04-29 MED ORDER — HEPARIN SODIUM (PORCINE) 5000 UNIT/ML IJ SOLN
3500.0000 [IU] | Freq: Once | INTRAMUSCULAR | Status: AC
Start: 2022-04-29 — End: 2022-04-29
  Administered 2022-04-29: 3500 [IU] via INTRAVENOUS

## 2022-04-29 MED ORDER — TICAGRELOR 90 MG PO TABS
ORAL_TABLET | ORAL | Status: DC | PRN
  Administered 2022-04-29: 180 mg via ORAL

## 2022-04-29 MED ORDER — ASPIRIN 81 MG PO CHEW
81.0000 mg | CHEWABLE_TABLET | Freq: Every day | ORAL | Status: DC
  Administered 2022-04-30 – 2022-05-01 (×2): 81 mg via ORAL
  Filled 2022-04-29 (×2): qty 1

## 2022-04-29 MED ORDER — NITROGLYCERIN 1 MG/10 ML FOR IR/CATH LAB
INTRA_ARTERIAL | Status: DC | PRN
  Administered 2022-04-29 (×2): 200 ug via INTRACORONARY

## 2022-04-29 MED ORDER — MIDAZOLAM HCL 2 MG/2ML IJ SOLN
INTRAMUSCULAR | Status: DC | PRN
  Administered 2022-04-29: 1 mg via INTRAVENOUS

## 2022-04-29 MED ORDER — METOPROLOL TARTRATE 5 MG/5ML IV SOLN
INTRAVENOUS | Status: AC
  Filled 2022-04-29: qty 5

## 2022-04-29 MED ORDER — VERAPAMIL HCL 2.5 MG/ML IV SOLN
INTRA_ARTERIAL | Status: DC | PRN
  Administered 2022-04-29: 10 mL via INTRA_ARTERIAL

## 2022-04-29 MED ORDER — LIDOCAINE HCL (PF) 1 % IJ SOLN
INTRAMUSCULAR | Status: AC
  Filled 2022-04-29: qty 30

## 2022-04-29 MED ORDER — HEPARIN SODIUM (PORCINE) 1000 UNIT/ML IJ SOLN
INTRAMUSCULAR | Status: DC | PRN
  Administered 2022-04-29: 3000 [IU] via INTRAVENOUS
  Administered 2022-04-29: 6000 [IU] via INTRAVENOUS

## 2022-04-29 MED ORDER — ONDANSETRON HCL 4 MG/2ML IJ SOLN: 4.0000 mg | Freq: Four times a day (QID) | INTRAMUSCULAR | Status: DC | PRN

## 2022-04-29 MED ORDER — SODIUM CHLORIDE 0.9 % IV SOLN: INTRAVENOUS | Status: DC

## 2022-04-29 MED ORDER — ATORVASTATIN CALCIUM 80 MG PO TABS
80.0000 mg | ORAL_TABLET | Freq: Every day | ORAL | Status: DC
  Administered 2022-04-29 – 2022-05-01 (×3): 80 mg via ORAL
  Filled 2022-04-29 (×3): qty 1

## 2022-04-29 MED ORDER — MORPHINE SULFATE (PF) 4 MG/ML IV SOLN
INTRAVENOUS | Status: AC
  Administered 2022-04-29: 4 mg via INTRAVENOUS
  Filled 2022-04-29: qty 1

## 2022-04-29 MED ORDER — SODIUM CHLORIDE 0.9% FLUSH: 3.0000 mL | INTRAVENOUS | Status: DC | PRN

## 2022-04-29 MED ORDER — MIDAZOLAM HCL 2 MG/2ML IJ SOLN
INTRAMUSCULAR | Status: AC
  Filled 2022-04-29: qty 2

## 2022-04-29 MED ORDER — TICAGRELOR 90 MG PO TABS
90.0000 mg | ORAL_TABLET | Freq: Two times a day (BID) | ORAL | Status: DC
  Administered 2022-04-30 – 2022-05-01 (×3): 90 mg via ORAL
  Filled 2022-04-29 (×3): qty 1

## 2022-04-29 MED ORDER — SODIUM CHLORIDE 0.9 % WEIGHT BASED INFUSION: 50.0000 mL/h | INTRAVENOUS | Status: AC

## 2022-04-29 MED ORDER — LIDOCAINE HCL (PF) 1 % IJ SOLN
INTRAMUSCULAR | Status: DC | PRN
  Administered 2022-04-29: 2 mL via INTRADERMAL

## 2022-04-29 MED ORDER — SODIUM CHLORIDE 0.9 % IV SOLN: 250.0000 mL | INTRAVENOUS | Status: DC | PRN

## 2022-04-29 MED ORDER — ACETAMINOPHEN 325 MG PO TABS: 650.0000 mg | ORAL_TABLET | ORAL | Status: DC | PRN

## 2022-04-29 MED ORDER — IOHEXOL 350 MG/ML SOLN
INTRAVENOUS | Status: DC | PRN
  Administered 2022-04-29: 120 mL

## 2022-04-29 MED ORDER — MORPHINE SULFATE (PF) 4 MG/ML IV SOLN: 4.0000 mg | Freq: Once | INTRAVENOUS | Status: AC

## 2022-04-29 MED ORDER — HEPARIN (PORCINE) 25000 UT/250ML-% IV SOLN
12.0000 [IU]/kg/h | INTRAVENOUS | Status: DC
  Administered 2022-04-29: 12 [IU]/kg/h via INTRAVENOUS
  Filled 2022-04-29: qty 250

## 2022-04-29 MED ORDER — DIAZEPAM 5 MG PO TABS: 10.0000 mg | ORAL_TABLET | Freq: Four times a day (QID) | ORAL | Status: DC | PRN

## 2022-04-29 MED ORDER — LABETALOL HCL 5 MG/ML IV SOLN: 10.0000 mg | INTRAVENOUS | Status: AC | PRN

## 2022-04-29 MED ORDER — HEPARIN (PORCINE) IN NACL 1000-0.9 UT/500ML-% IV SOLN
INTRAVENOUS | Status: AC
  Filled 2022-04-29: qty 500

## 2022-04-29 SURGICAL SUPPLY — 18 items
BALLN SAPPHIRE 2.0X12 (BALLOONS) ×1
BALLN SAPPHIRE 2.5X15 (BALLOONS) ×1
BALLOON SAPPHIRE 2.0X12 (BALLOONS) IMPLANT
BALLOON SAPPHIRE 2.5X15 (BALLOONS) IMPLANT
CATH OPTITORQUE TIG 4.0 5F (CATHETERS) IMPLANT
CATH VISTA GUIDE 6FR XBLAD3.5 (CATHETERS) IMPLANT
DEVICE RAD COMP TR BAND LRG (VASCULAR PRODUCTS) IMPLANT
GLIDESHEATH SLEND A-KIT 6F 22G (SHEATH) IMPLANT
GUIDEWIRE INQWIRE 1.5J.035X260 (WIRE) IMPLANT
INQWIRE 1.5J .035X260CM (WIRE) ×1
KIT ENCORE 26 ADVANTAGE (KITS) IMPLANT
KIT HEART LEFT (KITS) ×1 IMPLANT
PACK CARDIAC CATHETERIZATION (CUSTOM PROCEDURE TRAY) ×1 IMPLANT
STENT ONYX FRONTIER 3.5X22 (Permanent Stent) IMPLANT
TRANSDUCER W/STOPCOCK (MISCELLANEOUS) ×1 IMPLANT
TUBING CIL FLEX 10 FLL-RA (TUBING) ×1 IMPLANT
WIRE ASAHI PROWATER 180CM (WIRE) IMPLANT
WIRE COUGAR XT STRL 190CM (WIRE) IMPLANT

## 2022-04-29 NOTE — ED Notes (Signed)
Patient transported to cath lab with Dr Einar Gip at this time.

## 2022-04-29 NOTE — H&P (Signed)
Cardiology Admission History and Physical   Patient ID: Dylan Hughes MRN: [id number]; DOB: 12/11/1975   Admission date: 04/29/2022  PCP:  Default, Provider, MD   [facility] Providers Cardiologist:  None        Chief Complaint:  CP  Patient Profile:   Dylan Hughes is a 46 y.o. male with no significant PMHx although tobacco and alcohol user who is being seen 04/29/2022 for the evaluation of chest pain.  History of Present Illness:   Mr. Osuna states sudden onset epigastric tightness intermittently for 2 days which has progressively worsening since yesterday. The pain is worsened with exertion and slightly improved with ASA. The pain radiates to his left arm. The patient also reports SOB and occasional heart palpitations. Reports Stable weights. Denies fever, chills, dizziness, syncope, lightheadedness, nausea, vomiting, bendopnea, orthopnea, dysuria, diarrhea, pedal edema or any bleeding events.  Reports daily smoking. Last drink was 2 days ago (beer). Reports extensive family hx of CAD (mother and sister).  On arrival, currently chest pain (2/10), HD stable. Rhythm strips demonstrated diffuse STE V2-V6 and inferior leads, with STD in aVL.   Past Medical History:  Diagnosis Date   History of high cholesterol    Hypertension     No past surgical history on file.   Medications Prior to Admission: Prior to Admission medications   Medication Sig Start Date End Date Taking? Authorizing Provider  citalopram (CELEXA) 20 MG tablet Take 1 tablet (20 mg total) by mouth daily. 11/27/14   Jimmy Footman, MD  cyclobenzaprine (FLEXERIL) 10 MG tablet Take 1 tablet (10 mg total) by mouth 2 (two) times daily as needed for muscle spasms. 05/25/18   Fayrene Helper, PA-C  disulfiram (ANTABUSE) 250 MG tablet Take 1 tablet (250 mg total) by mouth daily. 11/30/14   Jimmy Footman, MD  hydrochlorothiazide (HYDRODIURIL) 25 MG tablet Take 1 tablet (25 mg total) by mouth  daily. 11/27/14   Jimmy Footman, MD  ibuprofen (ADVIL,MOTRIN) 600 MG tablet Take 1 tablet (600 mg total) by mouth every 6 (six) hours as needed. 05/25/18   Fayrene Helper, PA-C  nicotine (NICOTROL) 10 MG inhaler Inhale 1 cartridge (1 continuous puffing total) into the lungs as needed for smoking cessation. 11/27/14   Jimmy Footman, MD  predniSONE (DELTASONE) 20 MG tablet 3 tabs po day one, then 2 tabs daily x 4 days 05/25/18   Fayrene Helper, PA-C  simvastatin (ZOCOR) 20 MG tablet Take 1 tablet (20 mg total) by mouth daily at 6 PM. 11/27/14   Jimmy Footman, MD  traZODone (DESYREL) 100 MG tablet Take 1 tablet (100 mg total) by mouth at bedtime as needed for sleep. 11/27/14   Jimmy Footman, MD     Allergies:   No Known Allergies  Social History:   Social History   Socioeconomic History   Marital status: Single    Spouse name: Not on file   Number of children: Not on file   Years of education: Not on file   Highest education level: Not on file  Occupational History   Not on file  Tobacco Use   Smoking status: Every Day    Packs/day: 0.50    Years: 20.00    Total pack years: 10.00    Types: Cigarettes   Smokeless tobacco: Never  Substance and Sexual Activity   Alcohol use: Yes    Alcohol/week: 28.0 standard drinks of alcohol    Types: 8 Glasses of wine, 12 Cans of beer, 8 Shots of liquor per  week    Comment: Every other day.   Drug use: No   Sexual activity: Yes  Other Topics Concern   Not on file  Social History Narrative   Not on file   Social Determinants of Health   Financial Resource Strain: Not on file  Food Insecurity: Not on file  Transportation Needs: Not on file  Physical Activity: Not on file  Stress: Not on file  Social Connections: Not on file  Intimate Partner Violence: Not on file    Family History:   The patient's family history is not on file.    ROS:  Please see the history of present illness.  All other  ROS reviewed and negative.     Physical Exam/Data:  There were no vitals filed for this visit. No intake or output data in the 24 hours ending 04/29/22 1827    09/04/2019    2:11 AM 05/25/2018    2:58 PM 11/24/2014    9:54 PM  Last 3 Weights  Weight (lbs) 110 lb 123 lb   Weight (kg) 49.896 kg 55.792 kg      Information is confidential and restricted. Go to Review Flowsheets to unlock data.     There is no height or weight on file to calculate BMI.  General:  Well nourished, well developed, in no acute distress HEENT: normal Neck: no JVD Vascular: No carotid bruits; Distal pulses 2+ bilaterally   Cardiac:  normal S1, S2; RRR; no murmur  Lungs:  clear to auscultation bilaterally, no wheezing, rhonchi or rales  Abd: soft, nontender, no hepatomegaly  Ext: no edema Musculoskeletal:  No deformities, BUE and BLE strength normal and equal Skin: warm and dry  Neuro:  CNs 2-12 intact, no focal abnormalities noted Psych:  Normal affect    EKG:  The ECG that was done  was personally reviewed and demonstrates   Relevant CV Studies:   Laboratory Data:  High Sensitivity Troponin:  No results for input(s): "TROPONINIHS" in the last 720 hours.    ChemistryNo results for input(s): "NA", "K", "CL", "CO2", "GLUCOSE", "BUN", "CREATININE", "CALCIUM", "MG", "GFRNONAA", "GFRAA", "ANIONGAP" in the last 168 hours.  No results for input(s): "PROT", "ALBUMIN", "AST", "ALT", "ALKPHOS", "BILITOT" in the last 168 hours. Lipids No results for input(s): "CHOL", "TRIG", "HDL", "LABVLDL", "LDLCALC", "CHOLHDL" in the last 168 hours. HematologyNo results for input(s): "WBC", "RBC", "HGB", "HCT", "MCV", "MCH", "MCHC", "RDW", "PLT" in the last 168 hours. Thyroid No results for input(s): "TSH", "FREET4" in the last 168 hours. BNPNo results for input(s): "BNP", "PROBNP" in the last 168 hours.  DDimer No results for input(s): "DDIMER" in the last 168 hours.   Radiology/Studies:  No results  found.   Assessment and Plan:   #STEMI -emergent angiogram -ASA, continue heparin gtt and nitro gtt (SBP ) -risk stratification with A1C, TsH and lipid panel -acquire a TTE -Further management pending on coronary angiogram results   Risk Assessment/Risk Scores:    TIMI Risk Score for ST  Elevation MI:   The patient's TIMI risk score is 2, which indicates a 2.2% risk of all cause mortality at 30 days.   Severity of Illness: The appropriate patient status for this patient is INPATIENT. Inpatient status is judged to be reasonable and necessary in order to provide the required intensity of service to ensure the patient's safety. The patient's presenting symptoms, physical exam findings, and initial radiographic and laboratory data in the context of their chronic comorbidities is felt to place them at high  risk for further clinical deterioration. Furthermore, it is not anticipated that the patient will be medically stable for discharge from the hospital within 2 midnights of admission.   * I certify that at the point of admission it is my clinical judgment that the patient will require inpatient hospital care spanning beyond 2 midnights from the point of admission due to high intensity of service, high risk for further deterioration and high frequency of surveillance required.*   For questions or updates, please contact [place] Please consult [email or web address] for contact info under     Signed, Laurice Record, MD  04/29/2022 6:27 PM

## 2022-04-29 NOTE — ED Provider Notes (Signed)
  [place] EMERGENCY DEPARTMENT Provider Note   CSN: [id number] Arrival date & time: 04/29/22  1807     History {Add pertinent medical, surgical, social history, OB history to HPI:1} No chief complaint on file.   Dylan Hughes is a 46 y.o. male.  2 days of chest pain Given asa and nitro x3 and 10 mg morphine Also with sob no diaph Drinks smokes Mother and sister with mi but not him Hx of htn       Home Medications Prior to Admission medications   Medication Sig Start Date End Date Taking? Authorizing Provider  citalopram (CELEXA) 20 MG tablet Take 1 tablet (20 mg total) by mouth daily. 11/27/14   Hildred Priest, MD  cyclobenzaprine (FLEXERIL) 10 MG tablet Take 1 tablet (10 mg total) by mouth 2 (two) times daily as needed for muscle spasms. 05/25/18   Domenic Moras, PA-C  disulfiram (ANTABUSE) 250 MG tablet Take 1 tablet (250 mg total) by mouth daily. 11/30/14   Hildred Priest, MD  hydrochlorothiazide (HYDRODIURIL) 25 MG tablet Take 1 tablet (25 mg total) by mouth daily. 11/27/14   Hildred Priest, MD  ibuprofen (ADVIL,MOTRIN) 600 MG tablet Take 1 tablet (600 mg total) by mouth every 6 (six) hours as needed. 05/25/18   Domenic Moras, PA-C  nicotine (NICOTROL) 10 MG inhaler Inhale 1 cartridge (1 continuous puffing total) into the lungs as needed for smoking cessation. 11/27/14   Hildred Priest, MD  predniSONE (DELTASONE) 20 MG tablet 3 tabs po day one, then 2 tabs daily x 4 days 05/25/18   Domenic Moras, PA-C  simvastatin (ZOCOR) 20 MG tablet Take 1 tablet (20 mg total) by mouth daily at 6 PM. 11/27/14   Hildred Priest, MD  traZODone (DESYREL) 100 MG tablet Take 1 tablet (100 mg total) by mouth at bedtime as needed for sleep. 11/27/14   Hildred Priest, MD      Allergies    Patient has no known allergies.    Review of Systems   Review of Systems  Physical Exam Updated Vital Signs There were no  vitals taken for this visit. Physical Exam  ED Results / Procedures / Treatments   Labs (all labs ordered are listed, but only abnormal results are displayed) Labs Reviewed - No data to display  EKG None  Radiology No results found.  Procedures Procedures  {Document cardiac monitor, telemetry assessment procedure when appropriate:1}  Medications Ordered in ED Medications - No data to display  ED Course/ Medical Decision Making/ A&P                           Medical Decision Making Amount and/or Complexity of Data Reviewed Labs: ordered. Radiology: ordered.  Risk Prescription drug management.   ***  {Document critical care time when appropriate:1} {Document review of labs and clinical decision tools ie heart score, Chads2Vasc2 etc:1}  {Document your independent review of radiology images, and any outside records:1} {Document your discussion with family members, caretakers, and with consultants:1} {Document social determinants of health affecting pt's care:1} {Document your decision making why or why not admission, treatments were needed:1} Final Clinical Impression(s) / ED Diagnoses Final diagnoses:  None    Rx / DC Orders ED Discharge Orders     None

## 2022-04-29 NOTE — Progress Notes (Signed)
ANTICOAGULATION CONSULT NOTE - Initial Consult  Pharmacy Consult for Heparin Indication:  STEMI  No Known Allergies  Patient Measurements:   Heparin Dosing Weight: 55 kg  Vital Signs:    Labs: No results for input(s): "HGB", "HCT", "PLT", "APTT", "LABPROT", "INR", "HEPARINUNFRC", "HEPRLOWMOCWT", "CREATININE", "CKTOTAL", "CKMB", "TROPONINIHS" in the last 72 hours.  CrCl cannot be calculated (Patient's most recent lab result is older than the maximum 21 days allowed.).   Medical History: Past Medical History:  Diagnosis Date   History of high cholesterol    Hypertension     Medications:  (Not in a hospital admission)  Scheduled:  Infusions:   heparin     nitroGLYCERIN     PRN:   Assessment: 69 yom presenting with STEMI. Heparin per pharmacy consult placed for chest pain/ACS.   Patient is not on anticoagulation prior to arrival.  Hgb pending; plt pending  Goal of Therapy:  Heparin level 0.3-0.7 units/ml Monitor platelets by anticoagulation protocol: Yes   Plan:  Infusion started as waiting for back-up cath team to arrive Give IV heparin 3500 units bolus x 1 Start heparin infusion at 650 units/hr F/u plan for heparin post cath Continue to monitor H&H and platelets  Lorelei Pont, PharmD, BCPS 04/29/2022 6:21 PM ED Clinical Pharmacist -  [phone]

## 2022-04-29 NOTE — H&P (Signed)
CARDIOLOGY ADMIT NOTE   Patient ID: Dylan Hughes MRN: [id number] DOB/AGE: [birth year] 46 y.o.  Admit date: 04/29/2022 Primary Physician:  Default, Provider, MD  Patient ID: Dylan Hughes, male    DOB: 07/25/1975, 46 y.o.   MRN: [id number]  Chief Complaint  Patient presents with   Code STEMI   HPI:    Dylan Hughes  is a 46 y.o. Caucasian male patient with half a pack to 1 pack of cigarette smoking, hypertension, hyperlipidemia, presently not on any medical therapy, history of excess alcohol intake, presented to the emergency room with chest pain that started 2 days ago, yesterday it was on and off.  Today chest pain persisted, with radiation to his left side.  As the pain got worse, he activated the EMS, brought [facility] the [facility] to the [facility], ED.  Patient was writhing in pain.  Otherwise denies any other symptoms.  Past Medical History:  Diagnosis Date   History of high cholesterol    Hypertension    No past surgical history on file. Social History   Socioeconomic History   Marital status: Single    Spouse name: Not on file   Number of children: Not on file   Years of education: Not on file   Highest education level: Not on file  Occupational History   Not on file  Tobacco Use   Smoking status: Every Day    Packs/day: 0.50    Years: 20.00    Total pack years: 10.00    Types: Cigarettes   Smokeless tobacco: Never  Substance and Sexual Activity   Alcohol use: Yes    Alcohol/week: 28.0 standard drinks of alcohol    Types: 8 Glasses of wine, 12 Cans of beer, 8 Shots of liquor per week    Comment: Every other day.   Drug use: No   Sexual activity: Yes  Other Topics Concern   Not on file  Social History Narrative   Not on file   Social Determinants of Health   Financial Resource Strain: Not on file  Food Insecurity: Not on file  Transportation Needs: Not on file  Physical Activity: Not on file  Stress: Not on file  Social Connections: Not on file   Intimate Partner Violence: Not on file   No family history on file.  ROS  Review of Systems  Cardiovascular:  Positive for chest pain. Negative for dyspnea on exertion and leg swelling.   Objective      04/29/2022    9:15 PM 04/29/2022    9:00 PM 04/29/2022    8:45 PM  Vitals with BMI  Systolic  127 127  Diastolic  88 91  Pulse 84 85 90    Physical Exam Constitutional:      General: He is in acute distress.     Appearance: He is diaphoretic.  Neck:     Vascular: No carotid bruit or JVD.  Cardiovascular:     Rate and Rhythm: Regular rhythm. Tachycardia present.     Pulses: Intact distal pulses.     Heart sounds: Normal heart sounds. No murmur heard.    No gallop.  Pulmonary:     Effort: Pulmonary effort is normal.     Breath sounds: Normal breath sounds.  Abdominal:     General: Bowel sounds are normal.     Palpations: Abdomen is soft.  Musculoskeletal:     Right lower leg: No edema.     Left lower leg: No edema.  Laboratory examination:   Recent Labs    04/29/22 1822  NA 143  K 4.7  CL 107  CO2 24  GLUCOSE 106*  BUN 9  CREATININE 0.84  CALCIUM 9.0  GFRNONAA >60   CrCl cannot be calculated (Unknown ideal weight.).     Latest Ref Rng & Units 04/29/2022    6:22 PM 09/04/2019    2:46 AM 11/25/2014    1:46 PM  CMP  Glucose 70 - 99 mg/dL 132  96  98   BUN 6 - 20 mg/dL 9  11  19    Creatinine 0.61 - 1.24 mg/dL  4.40  1.02   Sodium 135 - 145 mmol/L 143  139  135   Potassium 3.5 - 5.1 mmol/L 4.7  4.6  4.0   Chloride 98 - 111 mmol/L 107  101  98   CO2 22 - 32 mmol/L 24  26  29    Calcium 8.9 - 10.3 mg/dL 9.0  9.2  9.2   Total Protein 6.5 - 8.1 g/dL 6.4  8.5  7.5   Total Bilirubin 0.3 - 1.2 mg/dL 0.6  0.5  0.4   Alkaline Phos 38 - 126 U/L 40  56  41   AST 15 - 41 U/L 24  31  20    ALT 0 - 44 U/L 16  30  20        Latest Ref Rng & Units 04/29/2022    6:22 PM 09/04/2019    2:46 AM 02/03/2012   11:01 PM  CBC  WBC 4.0 - 10.5 K/uL 7.3  7.4  8.9    Hemoglobin 13.0 - 17.0 g/dL  05/01/2022  09/06/2019   Hematocrit 39.0 - 52.0 % 46.0  48.8  46.7   Platelets 150 - 400 K/uL 318  287  299    Lipid Panel     Component Value Date/Time   CHOL 203 (H) 04/29/2022 1822   TRIG 63 04/29/2022 1822   HDL 47 04/29/2022 1822   CHOLHDL 4.3 04/29/2022 1822   VLDL 13 04/29/2022 1822   LDLCALC 143 (H) 04/29/2022 1822   HEMOGLOBIN A1C Lab Results  Component Value Date   HGBA1C 5.0 04/29/2022   MPG 96.8 04/29/2022   Cardiac Panel (last 3 results) Recent Labs    04/29/22 1822  TROPONINIHS 183*     Medications and allergies  No Known Allergies   [START ON 04/30/2022] sodium chloride     sodium chloride     nitroGLYCERIN     nitroGLYCERIN 5 mcg/min (04/29/22 2003)    Current Outpatient Medications  Medication Instructions   ibuprofen (ADVIL) 600 mg, Oral, Every 6 hours PRN   Radiology:  [facility] Chest Port 1 View  04/29/2022 CLINICAL DATA:  STEMI. Chest pain starting 2 days ago. Shortness of breath. EXAM: PORTABLE CHEST 1 VIEW COMPARISON:  None Available. FINDINGS: Heart size and pulmonary vascularity are normal for technique. Mild interstitial pattern to the lungs likely representing fibrosis. Early edema would be possible. No focal consolidation. No pleural effusions. No pneumothorax. Mediastinal contours appear intact. IMPRESSION: Mild interstitial changes in the lung bases likely due to fibrosis. Early edema less likely. Electronically Signed   By: 05/02/2022 M.D.   On: 04/29/2022 20:11   Cardiac Studies:   EKG 04/29/2022: 1944 hrs.: Extensive anterolateral STEMI with deep T wave inversions in the inferior leads and anterolateral leads.  Consider inferior ischemia.  Assessment   1.  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 2.  Primary hypertension 3.  Tobacco use disorder 4.  Alcohol abuse  Recommendations:   Patient writhing in pain, will directly taken to the  cardiac catheter lab on emergent basis.  I will address his primary hypertension and risk modification including smoking cessation and alcohol use once medically stable.   Adrian Prows, MD, [facility] 04/29/2022, 9:34 PM Office: [phone] Fax: [phone] Pager: [phone]

## 2022-04-29 NOTE — ED Triage Notes (Signed)
CP starting 2 days ago, with SOB, jaw and arm pain, 10mg  morphine, 1L NS, .4 mg nitro, 324mg  asa. 18LAC, 2L. A&Ox4 no NVD, 2/10 CP currently

## 2022-04-30 ENCOUNTER — Inpatient Hospital Stay (HOSPITAL_COMMUNITY): Payer: Self-pay

## 2022-04-30 LAB — CBC
HCT: 39.8 % (ref 39.0–52.0)
Hemoglobin: 13.9 g/dL (ref 13.0–17.0)
MCH: 32.4 pg (ref 26.0–34.0)
MCHC: 34.9 g/dL (ref 30.0–36.0)
MCV: 92.8 fL (ref 80.0–100.0)
Platelets: 265 10*3/uL (ref 150–400)
RBC: 4.29 MIL/uL (ref 4.22–5.81)
RDW: 13.1 % (ref 11.5–15.5)
WBC: 8.7 10*3/uL (ref 4.0–10.5)
nRBC: 0 % (ref 0.0–0.2)

## 2022-04-30 LAB — POCT I-STAT, CHEM 8
BUN: 9 mg/dL (ref 6–20)
Calcium, Ion: 1.14 mmol/L — ABNORMAL LOW (ref 1.15–1.40)
Chloride: 106 mmol/L (ref 98–111)
Creatinine, Ser: 0.7 mg/dL (ref 0.61–1.24)
Glucose, Bld: 115 mg/dL — ABNORMAL HIGH (ref 70–99)
HCT: 44 % (ref 39.0–52.0)
Hemoglobin: 15 g/dL (ref 13.0–17.0)
Potassium: 3.9 mmol/L (ref 3.5–5.1)
Sodium: 143 mmol/L (ref 135–145)
TCO2: 21 mmol/L — ABNORMAL LOW (ref 22–32)

## 2022-04-30 LAB — BASIC METABOLIC PANEL
Anion gap: 6 (ref 5–15)
BUN: 10 mg/dL (ref 6–20)
CO2: 24 mmol/L (ref 22–32)
Calcium: 8.4 mg/dL — ABNORMAL LOW (ref 8.9–10.3)
Chloride: 108 mmol/L (ref 98–111)
Creatinine, Ser: 0.63 mg/dL (ref 0.61–1.24)
GFR, Estimated: >60 mL/min (ref >60)
Glucose, Bld: 110 mg/dL — ABNORMAL HIGH (ref 70–99)
Potassium: 3.9 mmol/L (ref 3.5–5.1)
Sodium: 138 mmol/L (ref 135–145)

## 2022-04-30 LAB — ECHOCARDIOGRAM COMPLETE
Area-P 1/2: 3.53 cm2
Calc EF: 44.7 %
Height: 65 in
S' Lateral: 3.6 cm
Single Plane A2C EF: 47.4 %
Single Plane A4C EF: 40 %
Weight: 1950.63 oz

## 2022-04-30 LAB — POCT ACTIVATED CLOTTING TIME: Activated Clotting Time: 323 seconds

## 2022-04-30 LAB — TSH: TSH: 1.577 u[IU]/mL (ref 0.350–4.500)

## 2022-04-30 LAB — MRSA NEXT GEN BY PCR, NASAL: MRSA by PCR Next Gen: NOT DETECTED

## 2022-04-30 LAB — TROPONIN I (HIGH SENSITIVITY): Troponin I (High Sensitivity): 4023 ng/L (ref <18)

## 2022-04-30 MED ORDER — CHLORHEXIDINE GLUCONATE CLOTH 2 % EX PADS
6.0000 | MEDICATED_PAD | Freq: Every day | CUTANEOUS | Status: DC
  Administered 2022-04-30 – 2022-05-01 (×2): 6 via TOPICAL

## 2022-04-30 MED ORDER — ORAL CARE MOUTH RINSE: 15.0000 mL | OROMUCOSAL | Status: DC | PRN

## 2022-04-30 MED ORDER — PERFLUTREN LIPID MICROSPHERE
1.0000 mL | INTRAVENOUS | Status: AC | PRN
  Administered 2022-04-30: 2 mL via INTRAVENOUS

## 2022-04-30 NOTE — Progress Notes (Signed)
Patient transferred from [facility] at 1659hrs.  Oriented to unit and plan of care for shift. Patient verbalized understanding.  Right wrist level zero.

## 2022-04-30 NOTE — Progress Notes (Signed)
  Echocardiogram 2D Echocardiogram has been performed.  Dylan Hughes 04/30/2022, 10:45 AM

## 2022-04-30 NOTE — Progress Notes (Signed)
Subjective:  Patient seen and examined at bedside, resting comfortably. No acute events overnight since getting to his room. Currently getting echocardiogram. Patient denies chest pain, shortness of breath, palpitations, diaphoresis, syncope.  Intake/Output from previous day:  I/O last 3 completed shifts: In: 1222.7 [P.O.:720; I.V.:502.7] Out: 1050 [Urine:1050] Total I/O In: 154 [I.V.:154] Out: 400 [Urine:400] Net IO Since Admission: -73.24 mL [04/30/22 1115]  Blood pressure (!) 143/94, pulse 76, temperature 98.8 F (37.1 C), temperature source Oral, resp. rate 14, height 5' 5"  (1.651 m), weight 55.3 kg, SpO2 91 %. Physical Exam Vitals reviewed.  Cardiovascular:     Rate and Rhythm: Normal rate and regular rhythm.     Pulses: Normal pulses.     Heart sounds: No murmur heard. Pulmonary:     Effort: Pulmonary effort is normal.     Breath sounds: Normal breath sounds.  Abdominal:     General: Bowel sounds are normal.  Musculoskeletal:     Right lower leg: No edema.     Left lower leg: No edema.  Skin:    General: Skin is warm and dry.  Neurological:     Mental Status: He is alert.     Lab Results: Lab Results  Component Value Date   NA 138 04/30/2022   K 3.9 04/30/2022   CO2 24 04/30/2022   GLUCOSE 110 (H) 04/30/2022   BUN 10 04/30/2022   CREATININE 0.63 04/30/2022   CALCIUM 8.4 (L) 04/30/2022   GFRNONAA >60 04/30/2022    BNP (last 3 results) No results for input(s): "BNP" in the last 8760 hours.  ProBNP (last 3 results) No results for input(s): "PROBNP" in the last 8760 hours.    Latest Ref Rng & Units 04/30/2022    3:47 AM 04/29/2022    6:50 PM 04/29/2022    6:22 PM  BMP  Glucose 70 - 99 mg/dL 110  115  106   BUN 6 - 20 mg/dL 10  9  9    Creatinine 0.61 - 1.24 mg/dL 0.63  0.70  0.84   Sodium 135 - 145 mmol/L 138  143  143   Potassium 3.5 - 5.1 mmol/L 3.9  3.9  4.7   Chloride 98 - 111 mmol/L 108  106  107   CO2 22 - 32 mmol/L 24   24   Calcium 8.9 - 10.3  mg/dL 8.4   9.0       Latest Ref Rng & Units 04/29/2022    6:22 PM 09/04/2019    2:46 AM 11/25/2014    1:46 PM  Hepatic Function  Total Protein 6.5 - 8.1 g/dL 6.4  8.5  7.5   Albumin 3.5 - 5.0 g/dL 3.8  4.8  4.3   AST 15 - 41 U/L 24  31  20    ALT 0 - 44 U/L 16  30  20    Alk Phosphatase 38 - 126 U/L 40  56  41   Total Bilirubin 0.3 - 1.2 mg/dL 0.6  0.5  0.4       Latest Ref Rng & Units 04/30/2022    3:47 AM 04/29/2022    6:50 PM 04/29/2022    6:22 PM  CBC  WBC 4.0 - 10.5 K/uL 8.7   7.3   Hemoglobin 13.0 - 17.0 g/dL 13.9  15.0  15.8   Hematocrit 39.0 - 52.0 % 39.8  44.0  46.0   Platelets 150 - 400 K/uL 265   318    Lipid Panel  Component Value Date/Time   CHOL 203 (H) 04/29/2022 1822   TRIG 63 04/29/2022 1822   HDL 47 04/29/2022 1822   CHOLHDL 4.3 04/29/2022 1822   VLDL 13 04/29/2022 1822   LDLCALC 143 (H) 04/29/2022 1822   Cardiac Panel (last 3 results) No results for input(s): "CKTOTAL", "CKMB", "TROPONINI", "RELINDX" in the last 72 hours.  HEMOGLOBIN A1C Lab Results  Component Value Date   HGBA1C 5.0 04/29/2022   MPG 96.8 04/29/2022   TSH Recent Labs    04/30/22 0347  TSH 1.577   Imaging: Imaging results have been reviewed and ECHOCARDIOGRAM COMPLETE  Result Date: 04/30/2022    ECHOCARDIOGRAM REPORT   Patient Name:   Dylan Hughes Date of Exam: 04/30/2022 Medical Rec #:  [id number]    Height:       65.0 in Accession #:    [id number]   Weight:       121.9 lb Date of Birth:  01-06-1976   BSA:          1.603 m Patient Age:    46 years     BP:           137/107 mmHg Patient Gender: M            HR:           81 bpm. Exam Location:  Inpatient Procedure: 2D Echo, 3D Echo, Cardiac Doppler, Color Doppler and Intracardiac            Opacification Agent Indications:     122-I22.9 Subsequent ST elevation (STEM) and non-ST elevation                  (NSTEMI) myocardial infarction; I25.110 Atherosclerotic heart                  disease of native coronary artery with  unstable angina pectoris  History:         Patient has no prior history of Echocardiogram examinations.                  CAD and Acute MI, Signs/Symptoms:Chest Pain; Risk                  Factors:Hypertension and Dyslipidemia. ETOH.  Sonographer:     Roseanna Rainbow RDCS Referring Phys:  [id number] Adrian Prows Diagnosing Phys: Collene Mares Darra Rosa IMPRESSIONS  1. Left ventricular ejection fraction, by estimation, is 45 to 50%. Left ventricular ejection fraction by 3D volume is 41 %. Left ventricular ejection fraction by 2D MOD biplane is 44.7 %. Left ventricular ejection fraction by PLAX is 49 %. The left ventricle has mildly decreased function. The left ventricle demonstrates regional wall motion abnormalities (see scoring diagram/findings for description). Left ventricular diastolic parameters were normal.  2. Right ventricular systolic function is normal. The right ventricular size is normal.  3. The mitral valve is grossly normal. Trivial mitral valve regurgitation.  4. The aortic valve is grossly normal. Aortic valve regurgitation is not visualized. FINDINGS  Left Ventricle: Left ventricular ejection fraction, by estimation, is 45 to 50%. Left ventricular ejection fraction by PLAX is 49 %. Left ventricular ejection fraction by 2D MOD biplane is 44.7 %. Left ventricular ejection fraction by 3D volume is 41 %.  The left ventricle has mildly decreased function. The left ventricle demonstrates regional wall motion abnormalities. Definity contrast agent was given IV to delineate the left ventricular endocardial borders. The left ventricular internal cavity size was normal in size. There is no left ventricular hypertrophy.  Left ventricular diastolic parameters were normal. Normal left ventricular filling pressure.  LV Wall Scoring: The basal anteroseptal segment, basal anterolateral segment, basal anterior segment, and basal inferoseptal segment are hypokinetic. Right Ventricle: The right ventricular size is normal. No increase in  right ventricular wall thickness. Right ventricular systolic function is normal. Left Atrium: Left atrial size was normal in size. Right Atrium: Right atrial size was normal in size. Prominent Chiari network and Prominent Eustachian valve. Pericardium: There is no evidence of pericardial effusion. Mitral Valve: The mitral valve is grossly normal. Trivial mitral valve regurgitation. Tricuspid Valve: The tricuspid valve is grossly normal. Tricuspid valve regurgitation is not demonstrated. Aortic Valve: The aortic valve is grossly normal. Aortic valve regurgitation is not visualized. Pulmonic Valve: The pulmonic valve was grossly normal. Pulmonic valve regurgitation is not visualized. Aorta: The aortic root is normal in size and structure. IAS/Shunts: No atrial level shunt detected by color flow Doppler.  LEFT VENTRICLE PLAX 2D                        Biplane EF (MOD) LV EF:         Left            LV Biplane EF:   Left                ventricular                      ventricular                ejection                         ejection                fraction by                      fraction by                PLAX is 49                       2D MOD                %.                               biplane is LVIDd:         4.80 cm                          44.7 %. LVIDs:         3.60 cm LV PW:         2.00 cm         Diastology LV IVS:        1.10 cm         LV e' medial:    7.29 cm/s LVOT diam:     2.10 cm         LV E/e' medial:  6.9 LV SV:         62              LV e' lateral:   5.77 cm/s LV SV Index:   38              LV E/e' lateral: 8.8  LVOT Area:     3.46 cm                                 3D Volume EF LV Volumes (MOD)               LV 3D EF:    Left LV vol d, MOD    115.0 ml                   ventricul A2C:                                        ar LV vol d, MOD    89.6 ml                    ejection A4C:                                        fraction LV vol s, MOD    60.5 ml                    by 3D A2C:                                         volume is LV vol s, MOD    53.8 ml                    41 %. A4C: LV SV MOD A2C:   54.5 ml LV SV MOD A4C:   89.6 ml       3D Volume EF: LV SV MOD BP:    47.9 ml       3D EF:        41 %                                LV EDV:       190 ml                                LV ESV:       112 ml                                LV SV:        78 ml RIGHT VENTRICLE            IVC RV S prime:     7.07 cm/s  IVC diam: 1.30 cm TAPSE (M-mode): 1.6 cm LEFT ATRIUM             Index        RIGHT ATRIUM           Index LA diam:        2.40 cm 1.50 cm/m   RA Area:     16.90 cm LA Vol (A2C):   13.5 ml 8.42 ml/m   RA Volume:   46.20 ml  28.82 ml/m LA Vol (A4C):  20.0 ml 12.48 ml/m LA Biplane Vol: 17.3 ml 10.79 ml/m  AORTIC VALVE LVOT Vmax:   105.00 cm/s LVOT Vmean:  66.100 cm/s LVOT VTI:    0.178 m  AORTA Ao Root diam: 3.10 cm Ao Asc diam:  3.00 cm MITRAL VALVE MV Area (PHT): 3.53 cm    SHUNTS MV Decel Time: 215 msec    Systemic VTI:  0.18 m MV E velocity: 50.55 cm/s  Systemic Diam: 2.10 cm MV A velocity: 54.35 cm/s MV E/A ratio:  0.93 Raseel Jans Electronically signed by Floydene Flock Signature Date/Time: 04/30/2022/7:35:24 PM    Final    CARDIAC CATHETERIZATION  Result Date: 04/29/2022 Images from the original result were not included. Left Heart Catheterization 04/29/22: LV: 144/6, EDP 15 mmHg.  Ao 160/102, mean 128 mmHg.  No pressure gradient across the aortic valve. RCA: Dominant.  Has mild anterior origin.  Mild diffuse disease.  Gives origin to a small to moderate-sized PDA and a very large PL branch, mild disease. LM: Mild disease. RI: Moderate caliber vessel.  Mild disease. CX: Moderate caliber vessel, mild diffuse disease.  Moderate-sized OM 1 with ostial 20% stenosis. LAD: Gives origin to a moderate sized however large territory distribution D1 with mild disease.  At the origin of D2 which is moderate sized, LAD has a thrombotic 99% stenosis with sluggish flow in SP2.  Ostial D2  has a 95% stenosis.  LAD wraps around the apex. Intervention data: Successful balloon angioplasty of D2 with reduction of stenosis reduced from 95% to <25 to 30% with a 2 mm balloon angioplasty. Stenting of the mid LAD with 3.5 x 22 mm Onyx frontier DES, 99% thrombotic stenosis reduced to 0% with TIMI-3 to TIMI-3 flow. Recommendation: Patient will need aggressive risk modification.  DAPT for 1 year with aspirin along with Brilinta.   DG Chest Port 1 View  Result Date: 04/29/2022 CLINICAL DATA:  STEMI. Chest pain starting 2 days ago. Shortness of breath. EXAM: PORTABLE CHEST 1 VIEW COMPARISON:  None Available. FINDINGS: Heart size and pulmonary vascularity are normal for technique. Mild interstitial pattern to the lungs likely representing fibrosis. Early edema would be possible. No focal consolidation. No pleural effusions. No pneumothorax. Mediastinal contours appear intact. IMPRESSION: Mild interstitial changes in the lung bases likely due to fibrosis. Early edema less likely. Electronically Signed   By: Lucienne Capers M.D.   On: 04/29/2022 20:11    Cardiac Studies:    EKG 04/29/2022: Normal sinus rhythm at rate of 88 bpm, ST elevation in V1 through V6 and 2 3 aVF suggestive of acute injury pattern.  Prolonged QT.  Acute anterolateral and inferior STEMI.   EKG 04/30/2022: Normal sinus rhythm at rate of 68 bpm, marked T wave inversion in inferior leads and anterolateral leads from V3 to V6 suggestive of subendocardial infarct.  Prolonged QT.   Left Heart Catheterization 04/29/22:  LV: 144/6, EDP 15 mmHg.  Ao 160/102, mean 128 mmHg.  No pressure gradient across the aortic valve. RCA: Dominant.  Has mild anterior origin.  Mild diffuse disease.  Gives origin to a small to moderate-sized PDA and a very large PL branch, mild disease.   LM: Mild disease. RI: Moderate caliber vessel.  Mild disease. CX: Moderate caliber vessel, mild diffuse disease.  Moderate-sized OM 1 with ostial 20% stenosis. LAD:  Gives origin to a moderate sized however large territory distribution D1 with mild disease.  At the origin of D2 which is moderate sized, LAD has a thrombotic 99% stenosis with  sluggish flow in SP2.  Ostial D2 has a 95% stenosis.  LAD wraps around the apex.   Intervention data: Successful balloon angioplasty of D2 with reduction of stenosis reduced from 95% to <25 to 30% with a 2 mm balloon angioplasty. Stenting of the mid LAD with 3.5 x 22 mm Onyx frontier DES, 99% thrombotic stenosis reduced to 0% with TIMI-3 to TIMI-3 flow.   Recommendation: Patient will need aggressive risk modification.  DAPT for 1 year with aspirin along with Brilinta.        Echocardiogram 04/29/2022:   1. Left ventricular ejection fraction, by estimation, is 45 to 50%. Left ventricular ejection fraction by 3D volume is 41 %. Left ventricular ejection fraction by 2D MOD biplane is 44.7 %. Left ventricular ejection fraction by PLAX is 49 %. The left  ventricle has mildly decreased function. The left ventricle demonstrates regional wall motion abnormalities (see scoring diagram/findings for description). Left ventricular diastolic parameters were normal.  2. Right ventricular systolic function is normal. The right ventricular size is normal.  3. The mitral valve is grossly normal. Trivial mitral valve regurgitation.  4. The aortic valve is grossly normal. Aortic valve regurgitation is not visualized.  Recent Results (from the past 43800 hour(s))  ECHOCARDIOGRAM COMPLETE   Collection Time: 04/30/22 10:45 AM  Result Value   Weight 1,950.63   Height 65   BP 143/94   *Note: Due to a large number of results and/or encounters for the requested time period, some results have not been displayed. A complete set of results can be found in Results Review.    Scheduled Meds:  aspirin  81 mg Oral Daily   atorvastatin  80 mg Oral Daily   carvedilol  6.25 mg Oral BID WC   losartan  25 mg Oral QPM   sodium chloride flush  3 mL  Intravenous Q12H   ticagrelor  90 mg Oral BID   Continuous Infusions:  sodium chloride     nitroGLYCERIN 5 mcg/min (04/30/22 0900)   PRN Meds:.sodium chloride, acetaminophen, diazepam, nitroGLYCERIN, ondansetron (ZOFRAN) IV, mouth rinse, perflutren lipid microspheres (DEFINITY) IV suspension, sodium chloride flush  Assessment  Dylan Hughes is a 46 y.o. male patient with   1.  Acute extensive anterolateral STEMI, possible inferior ischemia, suspect wrap-around LAD stenosis/occlusion. 2.  Primary hypertension 3.  Tobacco use disorder 4.  Alcohol abuse  Plan:   Continue ASA and Brillinta Continue coreg, losartan, lipitor Recommend alcohol cessation Referral for cardiac rehab Anticipated discharge 05/01/2022    Floydene Flock, DO, [facility] 04/30/2022, 11:15 AM Office: [phone] Fax: [phone] Pager: [phone]

## 2022-05-01 ENCOUNTER — Encounter (HOSPITAL_COMMUNITY): Payer: Self-pay | Admitting: Cardiology

## 2022-05-01 ENCOUNTER — Other Ambulatory Visit: Payer: Self-pay

## 2022-05-01 ENCOUNTER — Telehealth: Payer: Self-pay | Admitting: Cardiology

## 2022-05-01 ENCOUNTER — Other Ambulatory Visit (HOSPITAL_COMMUNITY): Payer: Self-pay

## 2022-05-01 ENCOUNTER — Telehealth: Payer: Self-pay

## 2022-05-01 DIAGNOSIS — Z006 Encounter for examination for normal comparison and control in clinical research program: Secondary | ICD-10-CM

## 2022-05-01 MED ORDER — TICAGRELOR 90 MG PO TABS
90.0000 mg | ORAL_TABLET | Freq: Two times a day (BID) | ORAL | 0 refills | Status: AC
  Filled 2022-05-01: qty 60, 30d supply, fill #0

## 2022-05-01 MED ORDER — NITROGLYCERIN 0.4 MG SL SUBL
0.4000 mg | SUBLINGUAL_TABLET | SUBLINGUAL | 1 refills | Status: AC | PRN
  Filled 2022-05-01: qty 25, 7d supply, fill #0

## 2022-05-01 MED ORDER — ASPIRIN 81 MG PO CHEW
81.0000 mg | CHEWABLE_TABLET | Freq: Every day | ORAL | 2 refills | Status: AC
  Filled 2022-05-01: qty 30, 30d supply, fill #0

## 2022-05-01 MED ORDER — ATORVASTATIN CALCIUM 80 MG PO TABS
80.0000 mg | ORAL_TABLET | Freq: Every day | ORAL | 3 refills | Status: AC
  Filled 2022-05-01: qty 30, 30d supply, fill #0

## 2022-05-01 MED ORDER — CARVEDILOL 12.5 MG PO TABS
12.5000 mg | ORAL_TABLET | Freq: Two times a day (BID) | ORAL | 2 refills | Status: AC
  Filled 2022-05-01: qty 60, 30d supply, fill #0

## 2022-05-01 MED ORDER — NICOTINE 21 MG/24HR TD PT24
21.0000 mg | MEDICATED_PATCH | TRANSDERMAL | 0 refills | Status: AC
  Filled 2022-05-01: qty 30, 30d supply, fill #0

## 2022-05-01 MED ORDER — LOSARTAN POTASSIUM 50 MG PO TABS
50.0000 mg | ORAL_TABLET | Freq: Every evening | ORAL | 2 refills | Status: AC
  Filled 2022-05-01: qty 30, 30d supply, fill #0

## 2022-05-01 MED ORDER — ACETAMINOPHEN 325 MG PO TABS: 650.0000 mg | ORAL_TABLET | ORAL | Status: AC | PRN

## 2022-05-01 NOTE — Progress Notes (Signed)
CARDIAC REHAB PHASE I   PRE:  Rate/Rhythm: 76 SR    BP: sitting 118/90    SaO2:   MODE:  Ambulation: 400 ft   POST:  Rate/Rhythm: 85 SR    BP: sitting 127/87     SaO2:   Tolerated well, no c/o. Discussed MI, stent, restrictions, Brilinta importance, smoking cessation, diet, exercise, NTG, and CRPII. Pt voiced reception however pre contemplative regarding smoking cessation. Reinforced importance of meds. Will refer to [facility] however he does not have insurance.   [place] BS, ACSM-CEP 05/01/2022 9:35 AM

## 2022-05-01 NOTE — Care Management (Signed)
  Transition of Care [facility]) Screening Note   Patient Details  Name: Dylan Hughes Date of Birth: [birth year]   Transition of [facility] CM/SW Contact:    Bethena Roys, RN Phone Number: 05/01/2022, 10:01 AM    Transition of Care Department [facility]) has reviewed the patient. Patient is without insurance and PCP at this time. Patient will call the [facility] Department for a hospital follow up appointment to establish PCP needs. MD is aware to send all new medications to [place]. Case Manager will follow for [facility] assistance if eligible.

## 2022-05-01 NOTE — Research (Signed)
Evolve MI Informed Consent   Subject Name: Geoge Lawrance  Subject met inclusion and exclusion criteria.  The informed consent form, study requirements and expectations were reviewed with the subject and questions and concerns were addressed prior to the signing of the consent form.  The subject verbalized understanding of the trial requirements.  The subject agreed to participate in the Evolve MI trial and signed the informed consent at 1101 on 01 May 2022.  The informed consent was obtained prior to performance of any protocol-specific procedures for the subject.  A copy of the signed informed consent was given to the subject and a copy was placed in the subject's medical record.      Protocol # [id number]  Subject Initial ID#: [id number]                                Age in years: 46  *Demographics are found in the [place] EMR source.   Protocol Version: v2.00 [id number]  Were all Eligibility Criteria Met?  [x]  Yes  []   No  Screening Visit Date:   01-May-2022            Protocol # [id number]   Subject ID#   [id number]                            DAY 1 Date:   01-May-2022  Randomization:   Geographic Region:    [place]  Randomization Date:   01-May-2022  Randomization Time:  1124 AM     Treatment type Assigned   []  Treatment                                   [x]   Control            Bryson Dames, RN Research Coordinator

## 2022-05-01 NOTE — Discharge Summary (Signed)
Physician Discharge Summary  Patient ID: Dylan Hughes MRN: [id number] DOB/AGE: August 22, 1975 46 y.o. Default, Provider, MD   Admit date: 04/29/2022 Discharge date: 05/01/2022  Primary Discharge Diagnosis Acute anterolateral STEMI Primary hypertension Hypercholesterolemia Family history of premature coronary artery disease Tobacco use disorder Alcohol abuse Patient enrolled in clinical trials:   Protocol # [id number]   Significant Diagnostic Studies:  EKG   EKG 04/29/2022: Normal sinus rhythm at rate of 88 bpm, ST elevation in V1 through V6 and 2 3 aVF suggestive of acute injury pattern.  Prolonged QT.  Acute anterolateral and inferior STEMI.  EKG 04/30/2022: Normal sinus rhythm at rate of 68 bpm, marked T wave inversion in inferior leads and anterolateral leads from V3 to V6 suggestive of subendocardial infarct.  Prolonged QT.  Left Heart Catheterization 04/29/22:  LV: 144/6, EDP 15 mmHg.  Ao 160/102, mean 128 mmHg.  No pressure gradient across the aortic valve. RCA: Dominant.  Has mild anterior origin.  Mild diffuse disease.  Gives origin to a small to moderate-sized PDA and a very large PL branch, mild disease.   LM: Mild disease. RI: Moderate caliber vessel.  Mild disease. CX: Moderate caliber vessel, mild diffuse disease.  Moderate-sized OM 1 with ostial 20% stenosis. LAD: Gives origin to a moderate sized however large territory distribution D1 with mild disease.  At the origin of D2 which is moderate sized, LAD has a thrombotic 99% stenosis with sluggish flow in SP2.  Ostial D2 has a 95% stenosis.  LAD wraps around the apex.   Intervention data: Successful balloon angioplasty of D2 with reduction of stenosis reduced from 95% to <25 to 30% with a 2 mm balloon angioplasty. Stenting of the mid LAD with 3.5 x 22 mm Onyx frontier DES, 99% thrombotic stenosis reduced to 0% with TIMI-3 to TIMI-3 flow.   Recommendation: Patient will need aggressive risk modification.  DAPT for 1 year  with aspirin along with Brilinta.       Echocardiogram 04/29/2022:   1. Left ventricular ejection fraction, by estimation, is 45 to 50%. Left ventricular ejection fraction by 3D volume is 41 %. Left ventricular ejection fraction by 2D MOD biplane is 44.7 %. Left ventricular ejection fraction by PLAX is 49 %. The left  ventricle has mildly decreased function. The left ventricle demonstrates regional wall motion abnormalities (see scoring diagram/findings for description). Left ventricular diastolic parameters were normal.  2. Right ventricular systolic function is normal. The right ventricular size is normal.  3. The mitral valve is grossly normal. Trivial mitral valve regurgitation.  4. The aortic valve is grossly normal. Aortic valve regurgitation is not visualized.    Radiology:  [facility] Chest Port 1 View  04/29/2022 CLINICAL DATA:  STEMI. Chest pain starting 2 days ago. Shortness of breath. EXAM: PORTABLE CHEST 1 VIEW COMPARISON:  None Available. FINDINGS: Heart size and pulmonary vascularity are normal for technique. Mild interstitial pattern to the lungs likely representing fibrosis. Early edema would be possible. No focal consolidation. No pleural effusions. No pneumothorax. Mediastinal contours appear intact. IMPRESSION: Mild interstitial changes in the lung bases likely due to fibrosis. Early edema less likely. Electronically Signed   By: Lucienne Capers M.D.   On: 04/29/2022 20:11  Hospital Course: Dylan Hughes is a 46 y.o. Caucasian male patient with half a pack to 1 pack of cigarette smoking, hypertension, hyperlipidemia, presently not on any medical therapy, history of excess alcohol intake, presented to the emergency room with chest pain that started 2 days ago, yesterday it was  on and off.  Today chest pain persisted, with radiation to his left side.  As the pain got worse, he activated the EMS, brought [facility] the [facility] to the [facility], ED.  With a diagnosis of acute  extensive anterolateral STEMI, inferior STEMI, he was taken emergently to the cardiac catheterization lab.  He underwent successful angioplasty to the LAD.  He recuperated well, inpatient cardiac rehab saw him and he was able to ambulate without any chest pain or dyspnea.  On the day of hospital discharge he was stable and had recovered well.  Smoking cessation extensively discussed with the patient.  Patient still having difficulty with thoughts about quitting smoking.  The effects of smoking extensively discussed.  Compliance with medications discussed.  Recommendations on discharge: Patient will need aspirin indefinitely, Brilinta or Effient or Plavix at least for 1 year.  Patient unable to follow-up with [place] as he does not have any transportation, [place] [facility] at [facility] will be contacted.  We did contact [place] [facility] at [facility], there are no openings available until December, we have tried to contact the patient to come back and see [place] in 1 to 2 weeks, patient hung up on [place].  We will continue making effort for the patient to come and see [place].  Discharge Exam:    05/01/2022    8:28 AM 05/01/2022    4:15 AM 04/30/2022   11:04 PM  Vitals with BMI  Systolic 349 179 150  Diastolic 90 85 93  Pulse 66 76 68     Physical Exam Neck:     Vascular: No carotid bruit or JVD.  Cardiovascular:     Rate and Rhythm: Normal rate and regular rhythm.     Pulses: Intact distal pulses.     Heart sounds: Normal heart sounds. No murmur heard.    No gallop.  Pulmonary:     Effort: Pulmonary effort is normal.     Breath sounds: Normal breath sounds.  Abdominal:     General: Bowel sounds are normal.     Palpations: Abdomen is soft.  Musculoskeletal:     Right lower leg: No edema.     Left lower leg: No edema.   Labs:   Lab Results  Component Value Date   WBC 8.7 04/30/2022   HGB 13.9 04/30/2022   HCT 39.8 04/30/2022   MCV 92.8 04/30/2022   PLT 265 04/30/2022    Recent Labs  Lab  04/29/22 1822 04/29/22 1850 04/30/22 0347  NA 143   < > 138  K 4.7   < > 3.9  CL 107   < > 108  CO2 24  --  24  BUN 9   < > 10  CREATININE 0.84   < > 0.63  CALCIUM 9.0  --  8.4*  PROT 6.4*  --   --   BILITOT 0.6  --   --   ALKPHOS 40  --   --   ALT 16  --   --   AST 24  --   --   GLUCOSE 106*   < > 110*   < > = values in this interval not displayed.    Lipid Panel     Component Value Date/Time   CHOL 203 (H) 04/29/2022 1822   TRIG 63 04/29/2022 1822   HDL 47 04/29/2022 1822   CHOLHDL 4.3 04/29/2022 1822   VLDL 13 04/29/2022 1822   LDLCALC 143 (H) 04/29/2022 1822  BNP (last 3 results) No results for input(s): "BNP" in the last 8760 hours.  HEMOGLOBIN A1C Lab Results  Component Value Date   HGBA1C 5.0 04/29/2022   MPG 96.8 04/29/2022    Cardiac Panel (last 3 results) Recent Labs    04/29/22 1822 04/29/22 2028  TROPONINIHS 183* 4,023*     TSH Recent Labs    04/30/22 0347  TSH 1.577    FOLLOW UP PLANS AND APPOINTMENTS Discharge Instructions     Amb Referral to Cardiac Rehabilitation   Complete by: As directed    Diagnosis:  Coronary Stents STEMI PTCA     After initial evaluation and assessments completed: Virtual Based Care may be provided alone or in conjunction with Phase 2 Cardiac Rehab based on patient barriers.: Yes   Intensive Cardiac Rehabilitation (ICR) [place] location only OR Traditional Cardiac Rehabilitation (TCR) *If criteria for ICR are not met will enroll in TCR [facility] only): Yes      Allergies as of 05/01/2022   No Known Allergies      Medication List     TAKE these medications    acetaminophen 325 MG tablet Commonly known as: TYLENOL Take 2 tablets (650 mg total) by mouth every 4 (four) hours as needed for headache or mild pain. What changed:  medication strength how much to take when to take this reasons to take this   Aspirin Low Dose 81 MG chewable tablet Generic drug: aspirin Chew 1 tablet (81 mg total) by mouth  daily. Start taking on: May 02, 2022   atorvastatin 80 MG tablet Commonly known as: LIPITOR Take 1 tablet (80 mg total) by mouth daily. Start taking on: May 02, 2022   Brilinta 90 MG Tabs tablet Generic drug: ticagrelor Take 1 tablet (90 mg total) by mouth 2 (two) times daily.   carvedilol 12.5 MG tablet Commonly known as: COREG Take 1 tablet (12.5 mg total) by mouth 2 (two) times daily with a meal.   losartan 50 MG tablet Commonly known as: COZAAR Take 1 tablet (50 mg total) by mouth every evening.   nicotine 21 mg/24hr patch Commonly known as: NICODERM CQ - dosed in mg/24 hours Place 1 patch (21 mg total) onto the skin daily.   nitroGLYCERIN 0.4 MG SL tablet Commonly known as: NITROSTAT Place 1 tablet (0.4 mg total) under the tongue every 5 (five) minutes as needed for chest pain.        Follow-up Information     [facility] Follow up.   Why: Please call for a hosptial follow up appointment within 1-2 weeks post discharge to establish a primary care provider. Contact information: [place] [phone]         [facility]. Schedule an appointment as soon as possible for a visit in 1 week(s).   Why: We have sent referral for you there Contact information: [facility] at [place], [place], [place]: [phone]                 Adrian Prows, MD, [facility] 05/01/2022, 12:23 PM Office: [phone]

## 2022-05-01 NOTE — Telephone Encounter (Signed)
TOC call : Called patient, NA, LMAM

## 2022-05-01 NOTE — Discharge Instructions (Signed)
DO NOT STOP TAKING ANY MEDICATIONS

## 2022-05-01 NOTE — Telephone Encounter (Signed)
I called the patient's cell phone number listed, it does not belong to the patient.  I have cleared the cell phone number out of the system.  I also called his home number listed, it does not exist.  I called his father's number, no place to leave a message but went to voicemail

## 2022-05-02 LAB — LIPOPROTEIN A (LPA): Lipoprotein (a): 198.6 nmol/L — ABNORMAL HIGH (ref <75.0)

## 2022-05-02 NOTE — Telephone Encounter (Signed)
Called patient, cannot leave a voicemail, memory full.

## 2022-05-09 ENCOUNTER — Ambulatory Visit: Payer: Self-pay | Admitting: Internal Medicine

## 2022-08-02 ENCOUNTER — Telehealth: Payer: Self-pay | Admitting: *Deleted

## 2022-08-02 ENCOUNTER — Encounter: Payer: Self-pay | Admitting: *Deleted

## 2022-08-02 DIAGNOSIS — Z006 Encounter for examination for normal comparison and control in clinical research program: Secondary | ICD-10-CM

## 2022-08-02 NOTE — Telephone Encounter (Signed)
Tried to call patient for 12 week phone call for EVOLVE trial. No number listed and no way to leave message on phone listed for father. Mailed patient a letter asking does he want to continue the study.    [place] Joclynn Lumb, Research Coordinator 08/02/2021  11:18 am

## 2022-08-02 NOTE — Research (Signed)
Tried to call patient for 12 week phone call for EVOLVE Study. No number listed and voice mail full on father's cell phone. Mailed letter to patient asking does he still want to participate in the study.    [place] Dylann Layne, Research Coordinator 08/02/2021  11:20 am

## 2024-03-17 ENCOUNTER — Encounter: Payer: Self-pay | Admitting: *Deleted

## 2024-03-17 DIAGNOSIS — Z006 Encounter for examination for normal comparison and control in clinical research program: Secondary | ICD-10-CM

## 2024-03-17 NOTE — Research (Signed)
 Week 96 Follow-Up Visit Completed*   []  Not Necessary, No Potential Adverse Events Or Medication Issues Reported On Completed Subject Questionnaire   []  Yes, Contact With Subject/Alternate Contact Completed   []  Yes, No Contact With Subject/Alternate Contact Completed, But Electronic Health Record Was Reviewed   [x]  No, Unable To Contact Subject/Alternate Contact   Have you reviewed Ongoing medications on the Targeted Concomitant Medication form and updated the form as needed?   [x]  Yes   []  No   Subject Status*   []  Continuing In Follow-up   [x]  At Risk For Lost To Follow-up   []  Withdrawal From All Future Study Activities Including Passive Follow-up By Electronic Health Record Review Or Contact With Healthcare Provider Or Family Member/Friend   []  Death   Vital Status*   []  Alive   []  Deceased   [x]  Unknown   Last Known To Be Alive Source*   []  Subject Completed Follow-up Questionnaire/Seen In Person/Via Telephone Contact   []  Family Member or Caretaker   []  Primary Physician Or Medical Records   [x]  Publicly Available Source   []  Other  This patient has not had any visits since leaving the hospital. He never returned calls or certified letters. The omintrace by the study was used and resulted in one name and the phone number just rang over and over. Entered visit per sponsor.

## 2024-06-03 ENCOUNTER — Encounter: Payer: Self-pay | Admitting: *Deleted

## 2024-06-03 DIAGNOSIS — Z006 Encounter for examination for normal comparison and control in clinical research program: Secondary | ICD-10-CM

## 2024-06-03 NOTE — Research (Signed)
 Follow-Up Visit Completed*   []  Not Necessary, No Potential Adverse Events Or Medication Issues Reported On Completed Subject Questionnaire   []  Yes, Contact With Subject/Alternate Contact Completed   []  Yes, No Contact With Subject/Alternate Contact Completed, But Electronic Health Record Was Reviewed   [x]  No, Unable To Contact Subject/Alternate Contact   Have you reviewed Ongoing medications on the Targeted Concomitant Medication form and updated the form as needed?   [x]  Yes   []  No   Subject Status*   []  Continuing In Follow-up   [x]  At Risk For Lost To Follow-up   []  Withdrawal From All Future Study Activities Including Passive Follow-up By Electronic Health Record Review Or Contact With Healthcare Provider Or Family Member/Friend   []  Death   Vital Status*   []  Alive   []  Deceased   [x]  Unknown   Last Known To Be Alive Source*   []  Subject Completed Follow-up Questionnaire/Seen In Person/Via Telephone Contact   []  Family Member or Caretaker   []  Primary Physician Or Medical Records   [x]  Publicly Available Source   []  Other
